# Patient Record
Sex: Female | Born: 1960 | Race: White | Hispanic: No | Marital: Married | Smoking: Never smoker
Health system: Southern US, Community
[De-identification: ages and names within clinical notes are randomized; demographics above are authoritative.]

## PROBLEM LIST (undated history)

## (undated) DIAGNOSIS — M8580 Other specified disorders of bone density and structure, unspecified site: Secondary | ICD-10-CM

## (undated) HISTORY — DX: Other specified disorders of bone density and structure, unspecified site: M85.80

## (undated) HISTORY — PX: OTHER SURGICAL HISTORY: SHX169

---

## 1999-12-26 ENCOUNTER — Encounter: Admission: RE | Admit: 1999-12-26 | Discharge: 1999-12-26 | Payer: Self-pay | Admitting: Obstetrics and Gynecology

## 1999-12-26 ENCOUNTER — Encounter: Payer: Self-pay | Admitting: Obstetrics and Gynecology

## 2000-10-20 ENCOUNTER — Encounter (INDEPENDENT_AMBULATORY_CARE_PROVIDER_SITE_OTHER): Payer: Self-pay | Admitting: *Deleted

## 2000-10-20 ENCOUNTER — Other Ambulatory Visit: Admission: RE | Admit: 2000-10-20 | Discharge: 2000-10-20 | Payer: Self-pay | Admitting: Obstetrics and Gynecology

## 2000-12-27 ENCOUNTER — Encounter: Admission: RE | Admit: 2000-12-27 | Discharge: 2000-12-27 | Payer: Self-pay | Admitting: Obstetrics and Gynecology

## 2000-12-27 ENCOUNTER — Encounter: Payer: Self-pay | Admitting: Obstetrics and Gynecology

## 2001-06-21 ENCOUNTER — Encounter: Payer: Self-pay | Admitting: Family Medicine

## 2001-06-21 ENCOUNTER — Encounter: Admission: RE | Admit: 2001-06-21 | Discharge: 2001-06-21 | Payer: Self-pay | Admitting: Family Medicine

## 2001-12-29 ENCOUNTER — Encounter: Payer: Self-pay | Admitting: Obstetrics and Gynecology

## 2001-12-29 ENCOUNTER — Encounter: Admission: RE | Admit: 2001-12-29 | Discharge: 2001-12-29 | Payer: Self-pay | Admitting: Obstetrics and Gynecology

## 2002-01-04 ENCOUNTER — Encounter: Admission: RE | Admit: 2002-01-04 | Discharge: 2002-01-04 | Payer: Self-pay | Admitting: Obstetrics and Gynecology

## 2002-01-04 ENCOUNTER — Encounter: Payer: Self-pay | Admitting: Obstetrics and Gynecology

## 2003-01-19 ENCOUNTER — Encounter: Admission: RE | Admit: 2003-01-19 | Discharge: 2003-01-19 | Payer: Self-pay | Admitting: Obstetrics and Gynecology

## 2003-01-19 ENCOUNTER — Encounter: Payer: Self-pay | Admitting: Obstetrics and Gynecology

## 2004-01-22 ENCOUNTER — Encounter: Admission: RE | Admit: 2004-01-22 | Discharge: 2004-01-22 | Payer: Self-pay | Admitting: Obstetrics and Gynecology

## 2005-02-04 ENCOUNTER — Encounter: Admission: RE | Admit: 2005-02-04 | Discharge: 2005-02-04 | Payer: Self-pay | Admitting: Obstetrics and Gynecology

## 2006-02-10 ENCOUNTER — Encounter: Admission: RE | Admit: 2006-02-10 | Discharge: 2006-02-10 | Payer: Self-pay | Admitting: Obstetrics and Gynecology

## 2007-02-15 ENCOUNTER — Encounter: Admission: RE | Admit: 2007-02-15 | Discharge: 2007-02-15 | Payer: Self-pay | Admitting: Obstetrics and Gynecology

## 2008-02-20 ENCOUNTER — Encounter: Admission: RE | Admit: 2008-02-20 | Discharge: 2008-02-20 | Payer: Self-pay | Admitting: Obstetrics and Gynecology

## 2009-02-20 ENCOUNTER — Encounter: Admission: RE | Admit: 2009-02-20 | Discharge: 2009-02-20 | Payer: Self-pay | Admitting: Obstetrics and Gynecology

## 2010-02-26 ENCOUNTER — Encounter: Admission: RE | Admit: 2010-02-26 | Discharge: 2010-02-26 | Payer: Self-pay | Admitting: Obstetrics and Gynecology

## 2012-03-14 ENCOUNTER — Other Ambulatory Visit: Payer: Self-pay | Admitting: Obstetrics and Gynecology

## 2012-03-14 DIAGNOSIS — R928 Other abnormal and inconclusive findings on diagnostic imaging of breast: Secondary | ICD-10-CM

## 2012-03-21 ENCOUNTER — Ambulatory Visit
Admission: RE | Admit: 2012-03-21 | Discharge: 2012-03-21 | Disposition: A | Discharge status: Home / Self Care | Payer: 59 | Source: Ambulatory Visit | Attending: Obstetrics and Gynecology | Admitting: Obstetrics and Gynecology

## 2012-03-21 DIAGNOSIS — R928 Other abnormal and inconclusive findings on diagnostic imaging of breast: Secondary | ICD-10-CM

## 2015-05-30 MED FILL — ONDANSETRON HCL 8 MG TABLET: 8 | 8 days supply | Qty: 30 | Fill #0

## 2015-06-05 DIAGNOSIS — Z13820 Encounter for screening for osteoporosis: Secondary | ICD-10-CM | POA: Diagnosis not present

## 2015-06-05 DIAGNOSIS — Z8249 Family history of ischemic heart disease and other diseases of the circulatory system: Secondary | ICD-10-CM | POA: Diagnosis not present

## 2015-06-05 DIAGNOSIS — Z13220 Encounter for screening for lipoid disorders: Secondary | ICD-10-CM | POA: Diagnosis not present

## 2015-06-05 DIAGNOSIS — N83291 Other ovarian cyst, right side: Secondary | ICD-10-CM | POA: Diagnosis not present

## 2015-06-05 DIAGNOSIS — Z131 Encounter for screening for diabetes mellitus: Secondary | ICD-10-CM | POA: Diagnosis not present

## 2015-06-05 DIAGNOSIS — Z1329 Encounter for screening for other suspected endocrine disorder: Secondary | ICD-10-CM | POA: Diagnosis not present

## 2015-06-05 DIAGNOSIS — Z1321 Encounter for screening for nutritional disorder: Secondary | ICD-10-CM | POA: Diagnosis not present

## 2015-06-17 DIAGNOSIS — I493 Ventricular premature depolarization: Secondary | ICD-10-CM | POA: Diagnosis not present

## 2015-07-09 DIAGNOSIS — D2371 Other benign neoplasm of skin of right lower limb, including hip: Secondary | ICD-10-CM | POA: Diagnosis not present

## 2015-07-09 DIAGNOSIS — D1801 Hemangioma of skin and subcutaneous tissue: Secondary | ICD-10-CM | POA: Diagnosis not present

## 2015-07-09 DIAGNOSIS — L820 Inflamed seborrheic keratosis: Secondary | ICD-10-CM | POA: Diagnosis not present

## 2015-07-09 DIAGNOSIS — D2239 Melanocytic nevi of other parts of face: Secondary | ICD-10-CM | POA: Diagnosis not present

## 2015-09-03 DIAGNOSIS — Z0000 Encounter for general adult medical examination without abnormal findings: Secondary | ICD-10-CM | POA: Diagnosis not present

## 2015-09-03 DIAGNOSIS — E559 Vitamin D deficiency, unspecified: Secondary | ICD-10-CM | POA: Diagnosis not present

## 2015-09-03 DIAGNOSIS — I493 Ventricular premature depolarization: Secondary | ICD-10-CM | POA: Diagnosis not present

## 2015-09-23 DIAGNOSIS — M25562 Pain in left knee: Secondary | ICD-10-CM | POA: Diagnosis not present

## 2015-09-26 ENCOUNTER — Ambulatory Visit: Payer: 59 | Attending: Orthopaedic Surgery

## 2015-09-26 DIAGNOSIS — M25562 Pain in left knee: Secondary | ICD-10-CM | POA: Diagnosis not present

## 2015-09-26 DIAGNOSIS — M6281 Muscle weakness (generalized): Secondary | ICD-10-CM | POA: Diagnosis not present

## 2015-09-26 DIAGNOSIS — R262 Difficulty in walking, not elsewhere classified: Secondary | ICD-10-CM | POA: Diagnosis not present

## 2015-09-26 NOTE — Therapy (Signed)
[place], [place], [address] Phone: [phone]   Fax:  [phone]  Physical Therapy Evaluation  Patient Details  Name: Pamela Monroe MRN: [id number] Date of Birth: [birth year] Referring Provider: Melrose Nakayama, MD  Encounter Date: 09/26/2015      PT End of Session - 09/26/15 1407    Visit Number 1   Number of Visits 12   Date for PT Re-Evaluation 11/07/15   Authorization Type UMR   PT Start Time 0130   PT Stop Time 0208   PT Time Calculation (min) 38 min   Activity Tolerance Patient tolerated treatment well   Behavior During Therapy [employer] for tasks assessed/performed      No past medical history on file.  No past surgical history on file.  There were no vitals filed for this visit.       Subjective Assessment - 09/26/15 1335    Subjective She reports onset of pain  without injury.  and may have been caused by RT foot pain  that mat have changed gait. she has pain with movements walking up stairs, general walking and generally with bending LT knee.. MD said to come to PT and take aleve.    Limitations Walking  stairs sit with flexed knee   How long can you sit comfortably? As needed   How long can you stand comfortably? As needed   How long can you walk comfortably? As needed   Diagnostic tests xrays negative   Patient Stated Goals To feel like RT knee   Currently in Pain? Yes   Pain Score 4    Pain Location Knee   Pain Orientation Left;Lower   Pain Descriptors / Indicators Dull;Aching  sharper with going sit to stand to sit   Pain Type Acute pain   Pain Onset 1 to 4 weeks ago   Pain Frequency Intermittent  iwth medication   Aggravating Factors  transition movement , flexion knee and stairs   Pain Relieving Factors Aleve   Multiple Pain Sites No            [facility] PT Assessment - 09/26/15 1325    Assessment   Medical Diagnosis LT distal IT band syndrome   Referring Provider  Melrose Nakayama, MD   Onset Date/Surgical Date --  3 weeks ago   Next MD Visit 2-3 weeks if needed   Prior Therapy No   Precautions   Precautions None   Restrictions   Weight Bearing Restrictions No   Balance Screen   Has the patient fallen in the past 6 months No   Has the patient had a decrease in activity level because of a fear of falling?  No   Is the patient reluctant to leave their home because of a fear of falling?  No   Prior Function   Level of Independence Independent   Cognition   Overall Cognitive Status Within Functional Limits for tasks assessed   Functional Tests   Functional tests Squat   Squat   Comments 1/4 range or less with pain laterla Lt knee   ROM / Strength   AROM / PROM / Strength AROM;Strength   AROM   Overall AROM Comments All hip ROM equal to RT   AROM Assessment Site Hip;Knee   Right/Left Hip Right;Left   Right/Left Knee Right;Left   Right Knee Extension 0   Right Knee Flexion 138   Left Knee Extension 0   Left Knee Flexion 138  Strength   Overall Strength Comments Normal both legs except LT quads and hamstrings 4+/5 with pain She did not have increased pian with hip testing but with quad and hamstring testing .    Flexibility   Soft Tissue Assessment /Muscle Length --   Hamstrings RT 60 LT 55    Palpation   Palpation comment tender to touch along lateral  knee joint line more posterior .    Special Tests    Special Tests Hip Special Tests   Hip Special Tests  Marcello Moores Test;Patrick (FABER) Test;Ober's Test   Saralyn Pilar [facility]) Test   Findings Negative   Side Right;Left   Thomas Test    Findings Negative   Side Right;Left   Ober's Test   Findings Positive   Side Left   Comments for pain range WNL   Ambulation/Gait   Gait Comments WNL                           PT Education - 09/26/15 1406    Education provided Yes   Education Details POC, ITB stretch SLR   Person(s) Educated Patient   Methods  Explanation;Demonstration;Verbal cues;Handout   Comprehension Returned demonstration;Verbalized understanding          PT Short Term Goals - 09/26/15 1409    PT SHORT TERM GOAL #1   Title She will be independent with inital HEP   Time 2   Period Weeks   Status New   PT SHORT TERM GOAL #2   Title She will report pain decr 30% or mroe with wlaking   Time 3   Period Weeks   Status New           PT Long Term Goals - 09/26/15 1409    PT LONG TERM GOAL #1   Title She wil be able to demo all HEP issued   Time 6   PT LONG TERM GOAL #2   Title She will report 75% improvement with pain and able to walk and sit with no pain   Time 6   Period Weeks   Status New   PT LONG TERM GOAL #3   Title she will be able to walk steps at work with 1-2/10 max pain.   Time 6   Period Weeks   Status New   PT LONG TERM GOAL #4   Title She will  be able to rise and sit from chair with no pain.    Time 6   Period Weeks   Status New   PT LONG TERM GOAL #5   Title She will be able to flex and extend Lt knee without pain with good speed.    Time 6   Period Weeks   Status New               Plan - 09/26/15 1407    Clinical Impression Statement Ms Kai Levins presents with low complexity eval with lateral LT knee pain affecting her mobility. She should be improved post PT.    Rehab Potential Good   PT Frequency 2x / week   PT Duration 6 weeks  if needed   PT Treatment/Interventions Cryotherapy;Iontophoresis 4mg /ml Dexamethasone;Ultrasound;Moist Heat;Therapeutic exercise;Patient/family education;Taping;Manual techniques   PT Next Visit Plan Ionto if ordered, [place] ,ice ,STW, stretching    PT Home Exercise Plan SLR , ITB stretch   Consulted and Agree with Plan of Care Patient      Patient will benefit from skilled  therapeutic intervention in order to improve the following deficits and impairments:  Pain, Decreased mobility, Decreased activity tolerance, Difficulty walking, Decreased  strength  Visit Diagnosis: Difficulty in walking, not elsewhere classified - Plan: PT plan of care cert/re-cert  Pain in left knee - Plan: PT plan of care cert/re-cert  Muscle weakness (generalized) - Plan: PT plan of care cert/re-cert     Problem List There are no active problems to display for this patient.   Darrel Hoover  PT 09/26/2015, 2:17 PM  [facility] [address] [place], [place], [address] Phone: [phone]   Fax:  [phone]  Name: CORIANNE WERDER MRN: [id number] Date of Birth: [birth year]

## 2015-09-26 NOTE — Patient Instructions (Signed)
From cabinet issued SLR 20-30 reps  Daily and It band stretch 2x/day 30 sec 2-3 reps

## 2015-09-30 ENCOUNTER — Ambulatory Visit: Payer: 59 | Admitting: Physical Therapy

## 2015-10-01 ENCOUNTER — Ambulatory Visit: Payer: 59 | Admitting: Physical Therapy

## 2015-10-01 DIAGNOSIS — R262 Difficulty in walking, not elsewhere classified: Secondary | ICD-10-CM

## 2015-10-01 DIAGNOSIS — M25562 Pain in left knee: Secondary | ICD-10-CM

## 2015-10-01 DIAGNOSIS — M6281 Muscle weakness (generalized): Secondary | ICD-10-CM | POA: Diagnosis not present

## 2015-10-01 NOTE — Therapy (Signed)
[place], [place], [address] Phone: [phone]   Fax:  [phone]  Physical Therapy Treatment  Patient Details  Name: Pamela Monroe MRN: [id number] Date of Birth: 10/21/1960 Referring Provider: Melrose Nakayama, MD  Encounter Date: 10/01/2015      PT End of Session - 10/01/15 1816    Visit Number 2   Number of Visits 12   Date for PT Re-Evaluation 11/07/15   PT Start Time 0731   PT Stop Time 0805   PT Time Calculation (min) 34 min   Activity Tolerance Patient tolerated treatment well   Behavior During Therapy [facility] for tasks assessed/performed      No past medical history on file.  No past surgical history on file.  There were no vitals filed for this visit.      Subjective Assessment - 10/01/15 0733    Subjective She has been doing her home exercises.  I feel a little better.   Currently in Pain? Yes   Pain Score 4    Pain Location Knee   Pain Orientation Left;Lower;Lateral   Pain Descriptors / Indicators Aching;Dull   Pain Frequency Intermittent   Aggravating Factors  walking on stairs,     Pain Relieving Factors exercises                         [facility] Adult PT Treatment/Exercise - 10/01/15 0001    Self-Care   Self-Care --  Ionto guidelines/ precautions, contraindications, reviewed.   Ultrasound   Ultrasound Location distal lateral knee   Ultrasound Parameters 100%, 8 moinutes 1.0 watts / cm2.    Ultrasound Goals Pain   Iontophoresis   Type of Iontophoresis Dexamethasone   Location knee, lateral, distal IT band   Dose 4 mg/ml , 1cc   Time 6   Manual Therapy   Manual therapy comments soft tissue work lateral distal thigh/knee,  tissue sensitive, syiff, congested. ans was softened,  areas of tenderness proximal to joint line.                PT Education - 10/01/15 1816    Education provided Yes   Education Details Ionto information   Person(s)  Educated Patient   Methods Explanation;Handout   Comprehension Verbalized understanding          PT Short Term Goals - 10/01/15 1818    PT SHORT TERM GOAL #1   Title She will be independent with inital HEP   Period Weeks   Status On-going   PT SHORT TERM GOAL #2   Title She will report pain decr 30% or mroe with wlaking   Time 3   Period Weeks   Status On-going           PT Long Term Goals - 09/26/15 1409    PT LONG TERM GOAL #1   Title She wil be able to demo all HEP issued   Time 6   PT LONG TERM GOAL #2   Title She will report 75% improvement with pain and able to walk and sit with no pain   Time 6   Period Weeks   Status New   PT LONG TERM GOAL #3   Title she will be able to walk steps at work with 1-2/10 max pain.   Time 6   Period Weeks   Status New   PT LONG TERM GOAL #4   Title She will  be able  to rise and sit from chair with no pain.    Time 6   Period Weeks   Status New   PT LONG TERM GOAL #5   Title She will be able to flex and extend Lt knee without pain with good speed.    Time 6   Period Weeks   Status New               Plan - 10/01/15 1817    Clinical Impression Statement Pain relief focus today.  No new goals met.    PT Next Visit Plan assess ionto/US/Manual,  review exercises,  progress home exercises if able,  (Bend knee stretch was painful at home need to practice/review for technique)   PT Home Exercise Plan SLR , ITB stretchcontinue   Consulted and Agree with Plan of Care Patient      Patient will benefit from skilled therapeutic intervention in order to improve the following deficits and impairments:  Pain, Decreased mobility, Decreased activity tolerance, Difficulty walking, Decreased strength  Visit Diagnosis: Difficulty in walking, not elsewhere classified  Pain in left knee  Muscle weakness (generalized)     Problem List There are no active problems to display for this patient.   [facility] 10/01/2015, 6:20  PM  [facility] [address] [place], [place], [address] Phone: [phone]   Fax:  [phone]  Name: Pamela Monroe MRN: [id number] Date of Birth: 08/24/1960    Melvenia Needles, PTA 10/01/2015 6:20 PM Phone: [phone] Fax: [phone]

## 2015-10-01 NOTE — Patient Instructions (Signed)

## 2015-10-04 ENCOUNTER — Ambulatory Visit: Payer: 59

## 2015-10-04 DIAGNOSIS — M25562 Pain in left knee: Secondary | ICD-10-CM | POA: Diagnosis not present

## 2015-10-04 DIAGNOSIS — M6281 Muscle weakness (generalized): Secondary | ICD-10-CM | POA: Diagnosis not present

## 2015-10-04 DIAGNOSIS — R262 Difficulty in walking, not elsewhere classified: Secondary | ICD-10-CM

## 2015-10-04 MED FILL — valACYclovir HCL 1 GM TABS: 1 | 8 days supply | Qty: 30 | Fill #1

## 2015-10-04 NOTE — Patient Instructions (Signed)
From cabinet issued Pilates clams, SLR on side and standing hip abduction into wall 1-2x/day 10-15 reps 1-2 sets

## 2015-10-04 NOTE — Therapy (Signed)
[place], [place], [address] Phone: [phone]   Fax:  [phone]  Physical Therapy Treatment  Patient Details  Name: Pamela Monroe MRN: [id number] Date of Birth: 05-Feb-1961 Referring Provider: Melrose Nakayama, MD  Encounter Date: 10/04/2015      PT End of Session - 10/04/15 0752    Visit Number 3   Number of Visits 12   Date for PT Re-Evaluation 11/07/15   PT Start Time 0658   PT Stop Time 0745   PT Time Calculation (min) 47 min   Activity Tolerance Patient tolerated treatment well;No increased pain   Behavior During Therapy [facility] for tasks assessed/performed      No past medical history on file.  No past surgical history on file.  There were no vitals filed for this visit.      Subjective Assessment - 10/04/15 0700    Subjective Knee is not worse and ther are periods of improvement. Getting up from sitting is most painful   Currently in Pain? Yes   Pain Score 3    Pain Location Knee   Pain Orientation Left;Lateral   Pain Descriptors / Indicators Dull;Aching   Pain Type Acute pain   Pain Onset 1 to 4 weeks ago   Pain Frequency Intermittent   Multiple Pain Sites No                         [facility] Adult PT Treatment/Exercise - 10/04/15 0001    Exercises   Exercises Knee/Hip   Knee/Hip Exercises: Stretches   ITB Stretch Left;2 reps;60 seconds   ITB Stretch Limitations Modified with pillow support and cues for pelvic and leg position.    Knee/Hip Exercises: Standing   Abduction Limitations with bent knee isometrics into wll x 5 RT and LT 3-5 sec cue for pelvic position.   Knee/Hip Exercises: Sidelying   Hip ABduction Left;2 sets;10 reps   Hip ABduction Limitations with extension   Clams x 15 with demo and verbal cues.    Ultrasound   Ultrasound Location distal lateral left thigh   Ultrasound Parameters 100% 1MHx 1.5 wcm2   Ultrasound Goals Pain   Iontophoresis   Type of Iontophoresis Dexamethasone   Location knee, lateral, distal IT band   Dose 4 mg/ml , 1cc   Time 4-6 hours   Manual Therapy   Manual therapy comments soft tissue work lateral distal thigh/knee with rock tool,   areas of tenderness proximal to joint line.                PT Education - 10/04/15 0751    Education provided Yes   Education Details HEP hip strength   Person(s) Educated Patient   Methods Explanation;Demonstration;Tactile cues;Verbal cues;Handout   Comprehension Returned demonstration;Verbalized understanding          PT Short Term Goals - 10/04/15 0754    PT SHORT TERM GOAL #1   Title She will be independent with inital HEP   Status Achieved   PT SHORT TERM GOAL #2   Title She will report pain decr 30% or more with wlaking   Status Achieved           PT Long Term Goals - 09/26/15 1409    PT LONG TERM GOAL #1   Title She wil be able to demo all HEP issued   Time 6   PT LONG TERM GOAL #2   Title She will report  75% improvement with pain and able to walk and sit with no pain   Time 6   Period Weeks   Status New   PT LONG TERM GOAL #3   Title she will be able to walk steps at work with 1-2/10 max pain.   Time 6   Period Weeks   Status New   PT LONG TERM GOAL #4   Title She will  be able to rise and sit from chair with no pain.    Time 6   Period Weeks   Status New   PT LONG TERM GOAL #5   Title She will be able to flex and extend Lt knee without pain with good speed.    Time 6   Period Weeks   Status New               Plan - 10/04/15 [id number]    Clinical Impression Statement She is able to do all exercise correctly without pain  and when ITB stretch modified. Skin clear post ionto. Continues with pain on  transition movements out of chair.  continue modalities, manual   PT Next Visit Plan Continue Ionto , manual , [place] , Review HEP   PT Home Exercise Plan laterla hip strength exercise   Consulted and Agree with Plan of Care  Patient      Patient will benefit from skilled therapeutic intervention in order to improve the following deficits and impairments:  Pain, Decreased mobility, Decreased activity tolerance, Difficulty walking, Decreased strength  Visit Diagnosis: Difficulty in walking, not elsewhere classified  Pain in left knee  Muscle weakness (generalized)     Problem List There are no active problems to display for this patient.   Darrel Hoover  PT 10/04/2015, 7:55 AM  [facility] [address] [place], [place], [address] Phone: [phone]   Fax:  [phone]  Name: Pamela Monroe MRN: [id number] Date of Birth: [birth year]

## 2015-10-09 ENCOUNTER — Ambulatory Visit: Payer: 59

## 2015-10-09 DIAGNOSIS — M25562 Pain in left knee: Secondary | ICD-10-CM

## 2015-10-09 DIAGNOSIS — M6281 Muscle weakness (generalized): Secondary | ICD-10-CM

## 2015-10-09 DIAGNOSIS — R262 Difficulty in walking, not elsewhere classified: Secondary | ICD-10-CM | POA: Diagnosis not present

## 2015-10-09 NOTE — Therapy (Signed)
[place], [place], [address] Phone: [phone]   Fax:  [phone]  Physical Therapy Treatment  Patient Details  Name: Pamela Monroe MRN: [id number] Date of Birth: [birth year] Referring Provider: Melrose Nakayama, MD  Encounter Date: 10/09/2015      PT End of Session - 10/09/15 0709    Visit Number 4   Number of Visits 12   Date for PT Re-Evaluation 11/07/15   PT Start Time 0700   PT Stop Time 0745   PT Time Calculation (min) 45 min   Activity Tolerance Patient tolerated treatment well   Behavior During Therapy [facility] for tasks assessed/performed      No past medical history on file.  No past surgical history on file.  There were no vitals filed for this visit.      Subjective Assessment - 10/09/15 0705    Subjective Knee better but pain now at fibular head area. Turnig in  bed can give sharper pain . Going up stairs dull pain   Currently in Pain? No/denies   Pain Score 2    Pain Location Knee   Pain Orientation Left   Pain Descriptors / Indicators Dull;Sharp   Pain Type Acute pain   Pain Onset More than a month ago   Pain Frequency Intermittent   Aggravating Factors  stairs   Multiple Pain Sites No                         [facility] Adult PT Treatment/Exercise - 10/09/15 0713    Knee/Hip Exercises: Sidelying   Hip ABduction Left;2 sets;10 reps   Hip ABduction Limitations with red band   Clams x 15 with demo and verbal cues.   red band   Other Sidelying Knee/Hip Exercises LT hip IR 2x10 red band   Ultrasound   Ultrasound Location Later Lt knee   Iontophoresis   Type of Iontophoresis Dexamethasone   Location knee, lateral, distal IT band   Dose 4 mg/ml , 1cc   Time 4-6 hours   Manual Therapy   Manual therapy comments soft tissue work lateral distal hamstring insertion and muscle and Pa mobs to fibular head,                     PT Short Term Goals - 10/04/15  0754    PT SHORT TERM GOAL #1   Title She will be independent with inital HEP   Status Achieved   PT SHORT TERM GOAL #2   Title She will report pain decr 30% or more with wlaking   Status Achieved           PT Long Term Goals - 10/09/15 0752    PT LONG TERM GOAL #1   Title She wil be able to demo all HEP issued   Status On-going   PT LONG TERM GOAL #2   Title She will report 75% improvement with pain and able to walk and sit with no pain   Status Partially Met   PT LONG TERM GOAL #3   Title she will be able to walk steps at work with 1-2/10 max pain.   Status On-going   PT LONG TERM GOAL #4   Title She will  be able to rise and sit from chair with no pain.    Status Partially Met   PT LONG TERM GOAL #5   Title She will be able to flex  and extend Lt knee without pain with good speed.    Status Partially Met               Plan - 10/09/15 0750    Clinical Impression Statement Added red band to HEP without pain. She is improving but now more post lateral knee distal hamstring insertion area. Progressing toward goals   PT Next Visit Plan Continue Ionto , manual , [place] , Review HEP   Consulted and Agree with Plan of Care Patient      Patient will benefit from skilled therapeutic intervention in order to improve the following deficits and impairments:  Pain, Decreased mobility, Decreased activity tolerance, Difficulty walking, Decreased strength  Visit Diagnosis: Difficulty in walking, not elsewhere classified  Pain in left knee  Muscle weakness (generalized)     Problem List There are no active problems to display for this patient.   Darrel Hoover PT  10/09/2015, 7:53 AM  [facility] [address] [place], [place], [address] Phone: [phone]   Fax:  [phone]  Name: Pamela Monroe MRN: [id number] Date of Birth: March 07, 1961

## 2015-10-10 ENCOUNTER — Ambulatory Visit: Payer: 59 | Attending: Orthopaedic Surgery | Admitting: Physical Therapy

## 2015-10-10 DIAGNOSIS — R262 Difficulty in walking, not elsewhere classified: Secondary | ICD-10-CM

## 2015-10-10 DIAGNOSIS — M25562 Pain in left knee: Secondary | ICD-10-CM | POA: Diagnosis not present

## 2015-10-10 DIAGNOSIS — M6281 Muscle weakness (generalized): Secondary | ICD-10-CM

## 2015-10-10 NOTE — Therapy (Signed)
[place], [place], [address] Phone: [phone]   Fax:  [phone]  Physical Therapy Treatment  Patient Details  Name: Pamela Monroe MRN: [id number] Date of Birth: 19-Jun-1960 Referring Provider: Melrose Nakayama, MD  Encounter Date: 10/10/2015      PT End of Session - 10/10/15 1509    Visit Number 5   Number of Visits 12   Date for PT Re-Evaluation 11/07/15   Authorization Type UMR   PT Start Time 0306   PT Stop Time 0344   PT Time Calculation (min) 38 min      No past medical history on file.  No past surgical history on file.  There were no vitals filed for this visit.      Subjective Assessment - 10/10/15 1509    Subjective I am much better than i was    Currently in Pain? No/denies   Aggravating Factors  getting up from toilet, getting in /out of car   Pain Relieving Factors ionto patch                         [facility] Adult PT Treatment/Exercise - 10/10/15 0001    Knee/Hip Exercises: Stretches   Active Hamstring Stretch 3 reps;30 seconds   Active Hamstring Stretch Limitations with strap   ITB Stretch Left;3 reps;30 seconds   ITB Stretch Limitations with strap   Knee/Hip Exercises: Supine   Bridges with Clamshell 10 reps   Single Leg Bridge --  painful   Knee/Hip Exercises: Prone   Hamstring Curl 10 reps  mild pain   Hip Extension 10 reps  no pain   Hip Extension Limitations donkey kicks x 10 -increased pain   Ultrasound   Ultrasound Location lateral posterior knee (hamstring insertion)   Ultrasound Parameters 100% 1.5 w/cm2 1 MHZ   Ultrasound Goals Pain   Iontophoresis   Type of Iontophoresis Dexamethasone   Location knee, lateral, hamstring insertion   Dose 4 mg/ml , 1cc   Time 4-6 hours                  PT Short Term Goals - 10/04/15 0754    PT SHORT TERM GOAL #1   Title She will be independent with inital HEP   Status Achieved   PT SHORT  TERM GOAL #2   Title She will report pain decr 30% or more with wlaking   Status Achieved           PT Long Term Goals - 10/09/15 0752    PT LONG TERM GOAL #1   Title She wil be able to demo all HEP issued   Status On-going   PT LONG TERM GOAL #2   Title She will report 75% improvement with pain and able to walk and sit with no pain   Status Partially Met   PT LONG TERM GOAL #3   Title she will be able to walk steps at work with 1-2/10 max pain.   Status On-going   PT LONG TERM GOAL #4   Title She will  be able to rise and sit from chair with no pain.    Status Partially Met   PT LONG TERM GOAL #5   Title She will be able to flex and extend Lt knee without pain with good speed.    Status Partially Met               Plan -  10/10/15 1621    Clinical Impression Statement Pain with lateral hamstring pain during most exercises today. Encouraged pt to stretch hamstring at home. Ultrasound and ionto repeated to area. Pt reports the ionto patch was very helpful yesteray. She still feels pain wtih transitions like sit-stand.    PT Next Visit Plan Continue Ionto , manual , [place] , Review HEP      Patient will benefit from skilled therapeutic intervention in order to improve the following deficits and impairments:  Pain, Decreased mobility, Decreased activity tolerance, Difficulty walking, Decreased strength  Visit Diagnosis: Difficulty in walking, not elsewhere classified  Pain in left knee  Muscle weakness (generalized)     Problem List There are no active problems to display for this patient.   Dorene Ar, [place] 10/10/2015, 4:25 PM  [facility] [address] [place], [place], [address] Phone: [phone]   Fax:  [phone]  Name: Pamela Monroe MRN: [id number] Date of Birth: 21-May-1960

## 2015-10-14 ENCOUNTER — Ambulatory Visit: Payer: 59 | Admitting: Physical Therapy

## 2015-10-14 DIAGNOSIS — M6281 Muscle weakness (generalized): Secondary | ICD-10-CM

## 2015-10-14 DIAGNOSIS — R262 Difficulty in walking, not elsewhere classified: Secondary | ICD-10-CM | POA: Diagnosis not present

## 2015-10-14 DIAGNOSIS — M25562 Pain in left knee: Secondary | ICD-10-CM | POA: Diagnosis not present

## 2015-10-14 NOTE — Patient Instructions (Signed)
Calf Stretch    Place one leg forward, bent, other leg behind and straight. Lean forward keeping back heel flat. Hold _30___ seconds while counting out loud. Repeat with other leg forward. Repeat 3____ times. Do ___1-2_ sessions per day.  http://gt2.exer.us/478   Copyright  VHI. All rights reserved.

## 2015-10-14 NOTE — Therapy (Signed)
[place], [place], [address] Phone: [phone]   Fax:  [phone]  Physical Therapy Treatment  Patient Details  Name: Pamela Monroe MRN: [id number] Date of Birth: 29-Sep-1960 Referring Provider: Melrose Nakayama, MD  Encounter Date: 10/14/2015      PT End of Session - 10/14/15 0805    Visit Number 6   Number of Visits 12   Date for PT Re-Evaluation 11/07/15   PT Start Time 0731   PT Stop Time 0800   PT Time Calculation (min) 29 min   Activity Tolerance Patient tolerated treatment well;No increased pain   Behavior During Therapy [facility] for tasks assessed/performed      No past medical history on file.  No past surgical history on file.  There were no vitals filed for this visit.      Subjective Assessment - 10/14/15 0734    Subjective 1-2/10 .  with going up steps.  Able to exercise in the pool. Pain has moved lateral knee and proximal leg   Currently in Pain? No/denies   Pain Score 2    Pain Location Knee   Pain Orientation Lateral   Pain Descriptors / Indicators Dull;Sharp   Pain Frequency Intermittent   Aggravating Factors  steps, a certain pair of shoes   Pain Relieving Factors PT,                         [facility] Adult PT Treatment/Exercise - 10/14/15 0001    Knee/Hip Exercises: Stretches   Passive Hamstring Stretch 3 reps;30 seconds   Passive Hamstring Stretch Limitations with strap   Gastroc Stretch 3 reps;30 seconds  HEP   Ultrasound   Ultrasound Location Lateral posterior knee   Ultrasound Parameters 100 % 1.5 watts/cm2 X 10 minutes   Ultrasound Goals Pain   Iontophoresis   Type of Iontophoresis Dexamethasone   Location Lateral knee   Dose '4mg'$ /ml , 1cc   Time 4-6 hour/ 6 minutes                PT Education - 10/14/15 0801    Education provided Yes   Education Details calf stretch   Person(s) Educated Patient   Methods  Explanation;Demonstration;Verbal cues;Handout;Tactile cues   Comprehension Verbalized understanding;Returned demonstration          PT Short Term Goals - 10/04/15 0754    PT SHORT TERM GOAL #1   Title She will be independent with inital HEP   Status Achieved   PT SHORT TERM GOAL #2   Title She will report pain decr 30% or more with wlaking   Status Achieved           PT Long Term Goals - 10/14/15 0809    PT LONG TERM GOAL #1   Title She wil be able to demo all HEP issued   Time 6   Period Weeks   Status On-going   PT LONG TERM GOAL #2   Title She will report 75% improvement with pain and able to walk and sit with no pain   Time 6   Period Weeks   Status Unable to assess   PT LONG TERM GOAL #3   Title she will be able to walk steps at work with 1-2/10 max pain.   Baseline able to do over the weekend, not sure if consistant   Time 6   Period Weeks   Status Partially Met   PT LONG  TERM GOAL #4   Title She will  be able to rise and sit from chair with no pain.    Time 6   Period Weeks   Status Unable to assess   PT LONG TERM GOAL #5   Title She will be able to flex and extend Lt knee without pain with good speed.    Time 6   Period Weeks   Status Unable to assess               Plan - 10/14/15 0806    Clinical Impression Statement Progress toward home exercises.  Pain decreased to 1-2/10 with steps , now able to go step over step. No pain with session.    PT Next Visit Plan Continue Ionto , manual , [place] , Review HEP   PT Home Exercise Plan calf stretch   Consulted and Agree with Plan of Care Patient      Patient will benefit from skilled therapeutic intervention in order to improve the following deficits and impairments:  Pain, Decreased mobility, Decreased activity tolerance, Difficulty walking, Decreased strength  Visit Diagnosis: Difficulty in walking, not elsewhere classified  Pain in left knee  Muscle weakness (generalized)     Problem  List There are no active problems to display for this patient.   [facility] 10/14/2015, 8:11 AM  [place], [place], [address] Phone: [phone]   Fax:  [phone]  Name: Pamela Monroe MRN: [id number] Date of Birth: March 27, 1961    Melvenia Needles, PTA 10/14/2015 8:11 AM Phone: [phone] Fax: [phone]

## 2015-10-17 ENCOUNTER — Ambulatory Visit: Payer: 59

## 2015-10-17 DIAGNOSIS — M25562 Pain in left knee: Secondary | ICD-10-CM | POA: Diagnosis not present

## 2015-10-17 DIAGNOSIS — R262 Difficulty in walking, not elsewhere classified: Secondary | ICD-10-CM

## 2015-10-17 DIAGNOSIS — M6281 Muscle weakness (generalized): Secondary | ICD-10-CM | POA: Diagnosis not present

## 2015-10-17 NOTE — Therapy (Addendum)
[place], [place], [address] Phone: [phone]   Fax:  [phone]  Physical Therapy Treatment/ Discharge  Patient Details  Name: Pamela Monroe MRN: [id number] Date of Birth: Nov 30, 1960 Referring Provider: Melrose Nakayama, MD  Encounter Date: 10/17/2015      PT End of Session - 10/17/15 1547    Visit Number 7   Number of Visits 12   Date for PT Re-Evaluation 11/07/15   PT Start Time 0345   PT Stop Time 0415   PT Time Calculation (min) 30 min   Activity Tolerance Patient tolerated treatment well   Behavior During Therapy [facility] for tasks assessed/performed      No past medical history on file.  No past surgical history on file.  There were no vitals filed for this visit.      Subjective Assessment - 10/17/15 1705    Subjective doing well no pain just tenderness proximal lateral calf just posteior to fibula head. She is able to walk steps and generally she has no pain. She reports 90% or more improvement. She is able to walk without pain   Currently in Pain? No/denies                         [facility] Adult PT Treatment/Exercise - 10/17/15 0001    Knee/Hip Exercises: Stretches   Passive Hamstring Stretch Left;3 reps;60 seconds   Passive Hamstring Stretch Limitations with strap   Gastroc Stretch 3 reps;30 seconds  standing   Ultrasound   Ultrasound Location hz, 1.3 Wcm2   Ultrasound Parameters 100% 32M   Ultrasound Goals Pain   Iontophoresis   Type of Iontophoresis Dexamethasone   Location Lateral knee in area of tenderness   Dose '4mg'$ /ml , 1cc   Time 4-6 hour/ 6 minutes                  PT Short Term Goals - 10/04/15 0754    PT SHORT TERM GOAL #1   Title She will be independent with inital HEP   Status Achieved   PT SHORT TERM GOAL #2   Title She will report pain decr 30% or more with wlaking   Status Achieved           PT Long Term Goals - 10/17/15 1715     PT LONG TERM GOAL #1   Title She wil be able to demo all HEP issued   Status On-going   PT LONG TERM GOAL #2   Title She will report 75% improvement with pain and able to walk and sit with no pain   Status Achieved   PT LONG TERM GOAL #3   Title she will be able to walk steps at work with 1-2/10 max pain.   Baseline able to do over the weekend, not sure if consistant   Status Partially Met   PT LONG TERM GOAL #4   Title She will  be able to rise and sit from chair with no pain.    Status Achieved   PT LONG TERM GOAL #5   Title She will be able to flex and extend Lt knee without pain with good speed.    Status Partially Met               Plan - 10/17/15 1714    Clinical Impression Statement (90-95% better and she will consider discharge next week dependening on if she has pain over week  end   PT Next Visit Plan Continue Ionto , manual , [place] , Review HEP   Consulted and Agree with Plan of Care Patient      Patient will benefit from skilled therapeutic intervention in order to improve the following deficits and impairments:  Pain, Decreased mobility, Decreased activity tolerance, Difficulty walking, Decreased strength  Visit Diagnosis: Difficulty in walking, not elsewhere classified  Pain in left knee  Muscle weakness (generalized)     Problem List There are no active problems to display for this patient.   Darrel Hoover PT 10/17/2015, 5:16 PM  [facility] [address] [place], [place], [address] Phone: [phone]   Fax:  [phone]  Name: Pamela Monroe MRN: [id number] Date of Birth: 10-20-1960   PHYSICAL THERAPY DISCHARGE SUMMARY  Visits from Start of Care: 7  Current functional level related to goals / functional outcomes:      Unsure She did not return   Remaining deficits: Unknown   Education / Equipment: HEP Plan:                                                    Patient goals were  partially met. Patient is being discharged due to not returning since the last visit.  ?????    Pamela Monroe  Pamela Monroe   PT  11/9 /17         3:31 PM

## 2015-10-21 ENCOUNTER — Ambulatory Visit: Payer: 59 | Admitting: Physical Therapy

## 2015-10-24 ENCOUNTER — Encounter: Payer: 59 | Admitting: Physical Therapy

## 2015-10-25 DIAGNOSIS — H524 Presbyopia: Secondary | ICD-10-CM | POA: Diagnosis not present

## 2015-10-25 DIAGNOSIS — H5203 Hypermetropia, bilateral: Secondary | ICD-10-CM | POA: Diagnosis not present

## 2015-10-25 DIAGNOSIS — H52223 Regular astigmatism, bilateral: Secondary | ICD-10-CM | POA: Diagnosis not present

## 2016-01-09 DIAGNOSIS — H04123 Dry eye syndrome of bilateral lacrimal glands: Secondary | ICD-10-CM | POA: Diagnosis not present

## 2016-01-09 DIAGNOSIS — H40013 Open angle with borderline findings, low risk, bilateral: Secondary | ICD-10-CM | POA: Diagnosis not present

## 2016-01-09 DIAGNOSIS — H40033 Anatomical narrow angle, bilateral: Secondary | ICD-10-CM | POA: Diagnosis not present

## 2016-01-09 DIAGNOSIS — D3132 Benign neoplasm of left choroid: Secondary | ICD-10-CM | POA: Diagnosis not present

## 2016-01-09 DIAGNOSIS — H25093 Other age-related incipient cataract, bilateral: Secondary | ICD-10-CM | POA: Diagnosis not present

## 2016-01-16 MED FILL — valACYclovir HCL 1 GM TABS: 1 | 8 days supply | Qty: 30 | Fill #2

## 2016-05-19 MED FILL — valACYclovir HCL 1 GM TABS: 1 | 7 days supply | Qty: 30 | Fill #0

## 2016-06-09 DIAGNOSIS — Z23 Encounter for immunization: Secondary | ICD-10-CM | POA: Diagnosis not present

## 2016-06-09 DIAGNOSIS — Z1231 Encounter for screening mammogram for malignant neoplasm of breast: Secondary | ICD-10-CM | POA: Diagnosis not present

## 2016-06-09 DIAGNOSIS — Z6831 Body mass index (BMI) 31.0-31.9, adult: Secondary | ICD-10-CM | POA: Diagnosis not present

## 2016-06-09 DIAGNOSIS — Z01419 Encounter for gynecological examination (general) (routine) without abnormal findings: Secondary | ICD-10-CM | POA: Diagnosis not present

## 2016-06-29 DIAGNOSIS — L814 Other melanin hyperpigmentation: Secondary | ICD-10-CM | POA: Diagnosis not present

## 2016-06-29 DIAGNOSIS — D225 Melanocytic nevi of trunk: Secondary | ICD-10-CM | POA: Diagnosis not present

## 2016-06-29 DIAGNOSIS — L821 Other seborrheic keratosis: Secondary | ICD-10-CM | POA: Diagnosis not present

## 2016-06-29 DIAGNOSIS — D1801 Hemangioma of skin and subcutaneous tissue: Secondary | ICD-10-CM | POA: Diagnosis not present

## 2016-09-17 DIAGNOSIS — Z0000 Encounter for general adult medical examination without abnormal findings: Secondary | ICD-10-CM | POA: Diagnosis not present

## 2016-09-17 DIAGNOSIS — E559 Vitamin D deficiency, unspecified: Secondary | ICD-10-CM | POA: Diagnosis not present

## 2016-09-17 DIAGNOSIS — I493 Ventricular premature depolarization: Secondary | ICD-10-CM | POA: Diagnosis not present

## 2016-09-17 MED FILL — FLUCONAZOLE 150 MG TABLET: 150 | 1 days supply | Qty: 1 | Fill #0

## 2016-11-12 MED FILL — NAPROXEN 500 MG TABLET: 500 | 30 days supply | Qty: 60 | Fill #0

## 2016-11-24 DIAGNOSIS — H52223 Regular astigmatism, bilateral: Secondary | ICD-10-CM | POA: Diagnosis not present

## 2016-11-24 DIAGNOSIS — H524 Presbyopia: Secondary | ICD-10-CM | POA: Diagnosis not present

## 2016-11-24 DIAGNOSIS — H5203 Hypermetropia, bilateral: Secondary | ICD-10-CM | POA: Diagnosis not present

## 2016-12-30 MED FILL — IBUPROFEN 600 MG TABLET: 600 | 4 days supply | Qty: 16 | Fill #0

## 2016-12-31 DIAGNOSIS — H04123 Dry eye syndrome of bilateral lacrimal glands: Secondary | ICD-10-CM | POA: Diagnosis not present

## 2016-12-31 DIAGNOSIS — H40033 Anatomical narrow angle, bilateral: Secondary | ICD-10-CM | POA: Diagnosis not present

## 2016-12-31 DIAGNOSIS — H40013 Open angle with borderline findings, low risk, bilateral: Secondary | ICD-10-CM | POA: Diagnosis not present

## 2016-12-31 DIAGNOSIS — D3132 Benign neoplasm of left choroid: Secondary | ICD-10-CM | POA: Diagnosis not present

## 2016-12-31 DIAGNOSIS — H2513 Age-related nuclear cataract, bilateral: Secondary | ICD-10-CM | POA: Diagnosis not present

## 2017-03-24 DIAGNOSIS — N6489 Other specified disorders of breast: Secondary | ICD-10-CM | POA: Diagnosis not present

## 2017-03-25 ENCOUNTER — Other Ambulatory Visit: Payer: Self-pay | Admitting: Obstetrics and Gynecology

## 2017-03-25 DIAGNOSIS — N6489 Other specified disorders of breast: Secondary | ICD-10-CM

## 2017-03-29 ENCOUNTER — Ambulatory Visit: Payer: 59

## 2017-03-29 ENCOUNTER — Ambulatory Visit
Admission: RE | Admit: 2017-03-29 | Discharge: 2017-03-29 | Disposition: A | Discharge status: Home / Self Care | Payer: 59 | Source: Ambulatory Visit | Attending: Obstetrics and Gynecology | Admitting: Obstetrics and Gynecology

## 2017-03-29 ENCOUNTER — Ambulatory Visit: Admission: RE | Admit: 2017-03-29 | Payer: 59 | Source: Ambulatory Visit

## 2017-03-29 DIAGNOSIS — N6489 Other specified disorders of breast: Secondary | ICD-10-CM

## 2017-03-29 DIAGNOSIS — R922 Inconclusive mammogram: Secondary | ICD-10-CM | POA: Diagnosis not present

## 2017-03-31 ENCOUNTER — Other Ambulatory Visit: Payer: 59

## 2017-04-12 MED FILL — AZITHROMYCIN 250 MG TABLET: 250 | 5 days supply | Qty: 6 | Fill #0

## 2017-06-15 DIAGNOSIS — Z01419 Encounter for gynecological examination (general) (routine) without abnormal findings: Secondary | ICD-10-CM | POA: Diagnosis not present

## 2017-06-15 DIAGNOSIS — Z6833 Body mass index (BMI) 33.0-33.9, adult: Secondary | ICD-10-CM | POA: Diagnosis not present

## 2017-06-29 DIAGNOSIS — R1903 Right lower quadrant abdominal swelling, mass and lump: Secondary | ICD-10-CM | POA: Diagnosis not present

## 2017-07-05 DIAGNOSIS — L821 Other seborrheic keratosis: Secondary | ICD-10-CM | POA: Diagnosis not present

## 2017-07-05 DIAGNOSIS — D225 Melanocytic nevi of trunk: Secondary | ICD-10-CM | POA: Diagnosis not present

## 2017-07-15 DIAGNOSIS — H2513 Age-related nuclear cataract, bilateral: Secondary | ICD-10-CM | POA: Diagnosis not present

## 2017-07-15 DIAGNOSIS — H40033 Anatomical narrow angle, bilateral: Secondary | ICD-10-CM | POA: Diagnosis not present

## 2017-07-28 MED FILL — FLUCONAZOLE 150 MG TABLET: 150 | 1 days supply | Qty: 1 | Fill #1

## 2017-08-24 MED FILL — CLOTRIMAZOLE-BETAMETHASONE: 1-0.05 | 15 days supply | Qty: 45 | Fill #0

## 2017-10-20 DIAGNOSIS — Z136 Encounter for screening for cardiovascular disorders: Secondary | ICD-10-CM | POA: Diagnosis not present

## 2017-10-20 DIAGNOSIS — Z131 Encounter for screening for diabetes mellitus: Secondary | ICD-10-CM | POA: Diagnosis not present

## 2017-10-20 DIAGNOSIS — I493 Ventricular premature depolarization: Secondary | ICD-10-CM | POA: Diagnosis not present

## 2017-10-20 DIAGNOSIS — E559 Vitamin D deficiency, unspecified: Secondary | ICD-10-CM | POA: Diagnosis not present

## 2017-10-20 DIAGNOSIS — Z0000 Encounter for general adult medical examination without abnormal findings: Secondary | ICD-10-CM | POA: Diagnosis not present

## 2018-03-14 DIAGNOSIS — J029 Acute pharyngitis, unspecified: Secondary | ICD-10-CM | POA: Diagnosis not present

## 2018-03-14 DIAGNOSIS — R509 Fever, unspecified: Secondary | ICD-10-CM | POA: Diagnosis not present

## 2018-03-14 DIAGNOSIS — M545 Low back pain: Secondary | ICD-10-CM | POA: Diagnosis not present

## 2018-03-14 DIAGNOSIS — R8290 Unspecified abnormal findings in urine: Secondary | ICD-10-CM | POA: Diagnosis not present

## 2018-03-16 MED FILL — CIPROFLOXACIN HCL 500 MG TA: 500 | 5 days supply | Qty: 10 | Fill #0

## 2018-03-16 MED FILL — FLUCONAZOLE 150 MG TABS: 150 | 1 days supply | Qty: 1 | Fill #0

## 2018-03-16 MED FILL — AZITHROMYCIN 250 MG TABLET: 250 | 5 days supply | Qty: 6 | Fill #0

## 2018-06-21 DIAGNOSIS — Z1231 Encounter for screening mammogram for malignant neoplasm of breast: Secondary | ICD-10-CM | POA: Diagnosis not present

## 2018-06-21 DIAGNOSIS — Z6829 Body mass index (BMI) 29.0-29.9, adult: Secondary | ICD-10-CM | POA: Diagnosis not present

## 2018-06-21 DIAGNOSIS — Z01419 Encounter for gynecological examination (general) (routine) without abnormal findings: Secondary | ICD-10-CM | POA: Diagnosis not present

## 2018-06-21 DIAGNOSIS — Z808 Family history of malignant neoplasm of other organs or systems: Secondary | ICD-10-CM | POA: Diagnosis not present

## 2018-06-21 DIAGNOSIS — Z809 Family history of malignant neoplasm, unspecified: Secondary | ICD-10-CM | POA: Diagnosis not present

## 2018-07-12 DIAGNOSIS — L821 Other seborrheic keratosis: Secondary | ICD-10-CM | POA: Diagnosis not present

## 2018-07-12 DIAGNOSIS — D225 Melanocytic nevi of trunk: Secondary | ICD-10-CM | POA: Diagnosis not present

## 2018-07-12 DIAGNOSIS — D1801 Hemangioma of skin and subcutaneous tissue: Secondary | ICD-10-CM | POA: Diagnosis not present

## 2018-07-12 DIAGNOSIS — D2371 Other benign neoplasm of skin of right lower limb, including hip: Secondary | ICD-10-CM | POA: Diagnosis not present

## 2018-08-24 MED FILL — valACYclovir HCL 1 GM TABS: 1 | 15 days supply | Qty: 30 | Fill #0

## 2018-10-26 DIAGNOSIS — Z1159 Encounter for screening for other viral diseases: Secondary | ICD-10-CM | POA: Diagnosis not present

## 2018-10-26 DIAGNOSIS — I493 Ventricular premature depolarization: Secondary | ICD-10-CM | POA: Diagnosis not present

## 2018-10-26 DIAGNOSIS — E559 Vitamin D deficiency, unspecified: Secondary | ICD-10-CM | POA: Diagnosis not present

## 2018-10-26 DIAGNOSIS — Z13220 Encounter for screening for lipoid disorders: Secondary | ICD-10-CM | POA: Diagnosis not present

## 2018-10-26 DIAGNOSIS — M8588 Other specified disorders of bone density and structure, other site: Secondary | ICD-10-CM | POA: Diagnosis not present

## 2018-10-26 DIAGNOSIS — Z0000 Encounter for general adult medical examination without abnormal findings: Secondary | ICD-10-CM | POA: Diagnosis not present

## 2018-12-14 DIAGNOSIS — H52223 Regular astigmatism, bilateral: Secondary | ICD-10-CM | POA: Diagnosis not present

## 2018-12-14 DIAGNOSIS — H5203 Hypermetropia, bilateral: Secondary | ICD-10-CM | POA: Diagnosis not present

## 2019-03-13 MED FILL — valACYclovir HCL 1 GM TABS: 1 | 15 days supply | Qty: 30 | Fill #0

## 2019-04-03 MED FILL — CLOTRIMAZOLE-BETAMETHASONE: 1-0.05 | 7 days supply | Qty: 15 | Fill #0

## 2019-04-03 MED FILL — FLUCONAZOLE 150 MG TABLET: 150 | 1 days supply | Qty: 1 | Fill #0

## 2019-06-06 DIAGNOSIS — Z20828 Contact with and (suspected) exposure to other viral communicable diseases: Secondary | ICD-10-CM | POA: Diagnosis not present

## 2019-06-28 DIAGNOSIS — Z01419 Encounter for gynecological examination (general) (routine) without abnormal findings: Secondary | ICD-10-CM | POA: Diagnosis not present

## 2019-06-28 DIAGNOSIS — Z6831 Body mass index (BMI) 31.0-31.9, adult: Secondary | ICD-10-CM | POA: Diagnosis not present

## 2019-06-28 DIAGNOSIS — Z1231 Encounter for screening mammogram for malignant neoplasm of breast: Secondary | ICD-10-CM | POA: Diagnosis not present

## 2019-07-18 DIAGNOSIS — Z13820 Encounter for screening for osteoporosis: Secondary | ICD-10-CM | POA: Diagnosis not present

## 2019-07-19 DIAGNOSIS — L905 Scar conditions and fibrosis of skin: Secondary | ICD-10-CM | POA: Diagnosis not present

## 2019-07-19 DIAGNOSIS — D2261 Melanocytic nevi of right upper limb, including shoulder: Secondary | ICD-10-CM | POA: Diagnosis not present

## 2019-07-19 DIAGNOSIS — D2262 Melanocytic nevi of left upper limb, including shoulder: Secondary | ICD-10-CM | POA: Diagnosis not present

## 2019-07-19 DIAGNOSIS — D235 Other benign neoplasm of skin of trunk: Secondary | ICD-10-CM | POA: Diagnosis not present

## 2019-07-19 DIAGNOSIS — L821 Other seborrheic keratosis: Secondary | ICD-10-CM | POA: Diagnosis not present

## 2019-07-19 DIAGNOSIS — L814 Other melanin hyperpigmentation: Secondary | ICD-10-CM | POA: Diagnosis not present

## 2019-07-19 DIAGNOSIS — L0109 Other impetigo: Secondary | ICD-10-CM | POA: Diagnosis not present

## 2019-08-03 MED FILL — ONDANSETRON HCL 4 MG TABLET: 4 | 8 days supply | Qty: 30 | Fill #0

## 2019-08-29 DIAGNOSIS — D3132 Benign neoplasm of left choroid: Secondary | ICD-10-CM | POA: Diagnosis not present

## 2019-08-29 DIAGNOSIS — H2513 Age-related nuclear cataract, bilateral: Secondary | ICD-10-CM | POA: Diagnosis not present

## 2019-08-29 DIAGNOSIS — H40033 Anatomical narrow angle, bilateral: Secondary | ICD-10-CM | POA: Diagnosis not present

## 2019-08-29 DIAGNOSIS — H40013 Open angle with borderline findings, low risk, bilateral: Secondary | ICD-10-CM | POA: Diagnosis not present

## 2019-09-11 DIAGNOSIS — H40013 Open angle with borderline findings, low risk, bilateral: Secondary | ICD-10-CM | POA: Diagnosis not present

## 2019-09-11 DIAGNOSIS — H04123 Dry eye syndrome of bilateral lacrimal glands: Secondary | ICD-10-CM | POA: Diagnosis not present

## 2019-09-11 DIAGNOSIS — H2513 Age-related nuclear cataract, bilateral: Secondary | ICD-10-CM | POA: Diagnosis not present

## 2019-09-11 DIAGNOSIS — H40033 Anatomical narrow angle, bilateral: Secondary | ICD-10-CM | POA: Diagnosis not present

## 2019-09-25 DIAGNOSIS — H40033 Anatomical narrow angle, bilateral: Secondary | ICD-10-CM | POA: Diagnosis not present

## 2019-09-25 MED FILL — PREDNISOLONE AC 1% EYE DROP: 1 | 7 days supply | Qty: 5 | Fill #0

## 2019-10-18 DIAGNOSIS — H40033 Anatomical narrow angle, bilateral: Secondary | ICD-10-CM | POA: Diagnosis not present

## 2019-10-31 DIAGNOSIS — E559 Vitamin D deficiency, unspecified: Secondary | ICD-10-CM | POA: Diagnosis not present

## 2019-10-31 DIAGNOSIS — Z13220 Encounter for screening for lipoid disorders: Secondary | ICD-10-CM | POA: Diagnosis not present

## 2019-10-31 DIAGNOSIS — R030 Elevated blood-pressure reading, without diagnosis of hypertension: Secondary | ICD-10-CM | POA: Diagnosis not present

## 2019-10-31 DIAGNOSIS — Z131 Encounter for screening for diabetes mellitus: Secondary | ICD-10-CM | POA: Diagnosis not present

## 2019-10-31 DIAGNOSIS — Z0000 Encounter for general adult medical examination without abnormal findings: Secondary | ICD-10-CM | POA: Diagnosis not present

## 2019-10-31 DIAGNOSIS — M8588 Other specified disorders of bone density and structure, other site: Secondary | ICD-10-CM | POA: Diagnosis not present

## 2019-11-06 MED FILL — FLUCONAZOLE 150 MG TABS: 150 | 1 days supply | Qty: 1 | Fill #1

## 2019-11-14 DIAGNOSIS — R05 Cough: Secondary | ICD-10-CM | POA: Diagnosis not present

## 2019-11-14 DIAGNOSIS — J069 Acute upper respiratory infection, unspecified: Secondary | ICD-10-CM | POA: Diagnosis not present

## 2019-11-14 MED FILL — PROMETHAZINE W/COD SYRUP: 6.25-10 | 6 days supply | Qty: 120 | Fill #0

## 2019-11-29 DIAGNOSIS — H40033 Anatomical narrow angle, bilateral: Secondary | ICD-10-CM | POA: Diagnosis not present

## 2019-11-29 DIAGNOSIS — H40013 Open angle with borderline findings, low risk, bilateral: Secondary | ICD-10-CM | POA: Diagnosis not present

## 2020-07-02 DIAGNOSIS — Z6833 Body mass index (BMI) 33.0-33.9, adult: Secondary | ICD-10-CM | POA: Diagnosis not present

## 2020-07-02 DIAGNOSIS — Z01419 Encounter for gynecological examination (general) (routine) without abnormal findings: Secondary | ICD-10-CM | POA: Diagnosis not present

## 2020-07-08 ENCOUNTER — Other Ambulatory Visit: Payer: Self-pay | Admitting: Obstetrics and Gynecology

## 2020-07-08 DIAGNOSIS — R928 Other abnormal and inconclusive findings on diagnostic imaging of breast: Secondary | ICD-10-CM

## 2020-07-10 ENCOUNTER — Other Ambulatory Visit: Payer: Self-pay

## 2020-07-10 ENCOUNTER — Ambulatory Visit: Payer: 59

## 2020-07-10 ENCOUNTER — Ambulatory Visit
Admission: RE | Admit: 2020-07-10 | Discharge: 2020-07-10 | Disposition: A | Discharge status: Home / Self Care | Payer: 59 | Source: Ambulatory Visit | Attending: Obstetrics and Gynecology | Admitting: Obstetrics and Gynecology

## 2020-07-10 ENCOUNTER — Other Ambulatory Visit: Payer: 59

## 2020-07-10 DIAGNOSIS — R928 Other abnormal and inconclusive findings on diagnostic imaging of breast: Secondary | ICD-10-CM

## 2020-07-13 ENCOUNTER — Other Ambulatory Visit: Payer: 59

## 2020-07-22 DIAGNOSIS — D1801 Hemangioma of skin and subcutaneous tissue: Secondary | ICD-10-CM | POA: Diagnosis not present

## 2020-07-22 DIAGNOSIS — D2261 Melanocytic nevi of right upper limb, including shoulder: Secondary | ICD-10-CM | POA: Diagnosis not present

## 2020-07-22 DIAGNOSIS — D225 Melanocytic nevi of trunk: Secondary | ICD-10-CM | POA: Diagnosis not present

## 2020-07-22 DIAGNOSIS — D2262 Melanocytic nevi of left upper limb, including shoulder: Secondary | ICD-10-CM | POA: Diagnosis not present

## 2020-07-22 DIAGNOSIS — L821 Other seborrheic keratosis: Secondary | ICD-10-CM | POA: Diagnosis not present

## 2020-07-22 DIAGNOSIS — L814 Other melanin hyperpigmentation: Secondary | ICD-10-CM | POA: Diagnosis not present

## 2020-07-23 ENCOUNTER — Other Ambulatory Visit: Payer: 59

## 2020-08-15 ENCOUNTER — Other Ambulatory Visit (HOSPITAL_COMMUNITY): Payer: Self-pay

## 2020-08-15 MED ORDER — ONDANSETRON 8 MG PO TBDP
8.0000 mg | ORAL_TABLET | Freq: Two times a day (BID) | ORAL | 1 refills | Status: AC
  Filled 2020-08-15: qty 30, 15d supply, fill #0

## 2020-09-24 DIAGNOSIS — H40033 Anatomical narrow angle, bilateral: Secondary | ICD-10-CM | POA: Diagnosis not present

## 2020-09-24 DIAGNOSIS — H04123 Dry eye syndrome of bilateral lacrimal glands: Secondary | ICD-10-CM | POA: Diagnosis not present

## 2020-09-24 DIAGNOSIS — D4981 Neoplasm of unspecified behavior of retina and choroid: Secondary | ICD-10-CM | POA: Diagnosis not present

## 2020-09-24 DIAGNOSIS — H40013 Open angle with borderline findings, low risk, bilateral: Secondary | ICD-10-CM | POA: Diagnosis not present

## 2020-09-24 DIAGNOSIS — H2513 Age-related nuclear cataract, bilateral: Secondary | ICD-10-CM | POA: Diagnosis not present

## 2020-10-31 DIAGNOSIS — I493 Ventricular premature depolarization: Secondary | ICD-10-CM | POA: Diagnosis not present

## 2020-10-31 DIAGNOSIS — Z13220 Encounter for screening for lipoid disorders: Secondary | ICD-10-CM | POA: Diagnosis not present

## 2020-10-31 DIAGNOSIS — M8588 Other specified disorders of bone density and structure, other site: Secondary | ICD-10-CM | POA: Diagnosis not present

## 2020-10-31 DIAGNOSIS — Z0000 Encounter for general adult medical examination without abnormal findings: Secondary | ICD-10-CM | POA: Diagnosis not present

## 2020-10-31 DIAGNOSIS — E559 Vitamin D deficiency, unspecified: Secondary | ICD-10-CM | POA: Diagnosis not present

## 2020-11-14 DIAGNOSIS — M79672 Pain in left foot: Secondary | ICD-10-CM | POA: Diagnosis not present

## 2020-11-14 DIAGNOSIS — M7732 Calcaneal spur, left foot: Secondary | ICD-10-CM | POA: Diagnosis not present

## 2021-05-03 IMAGING — MG MM DIGITAL DIAGNOSTIC UNILAT*L* W/ TOMO W/ CAD
6 series · 6 of 18 positions shown · non-contrast
Comparison: Previous exam(s).

CLINICAL DATA: 59-year-old female for further evaluation of
possible LEFT breast mass on screening mammogram.

EXAM:
DIGITAL DIAGNOSTIC UNILATERAL LEFT MAMMOGRAM WITH TOMOSYNTHESIS AND
CAD
TECHNIQUE: Left digital diagnostic mammography and breast tomosynthesis was
performed. The images were evaluated with computer-aided detection.

[L MLO synth-2D (1 of 2)]
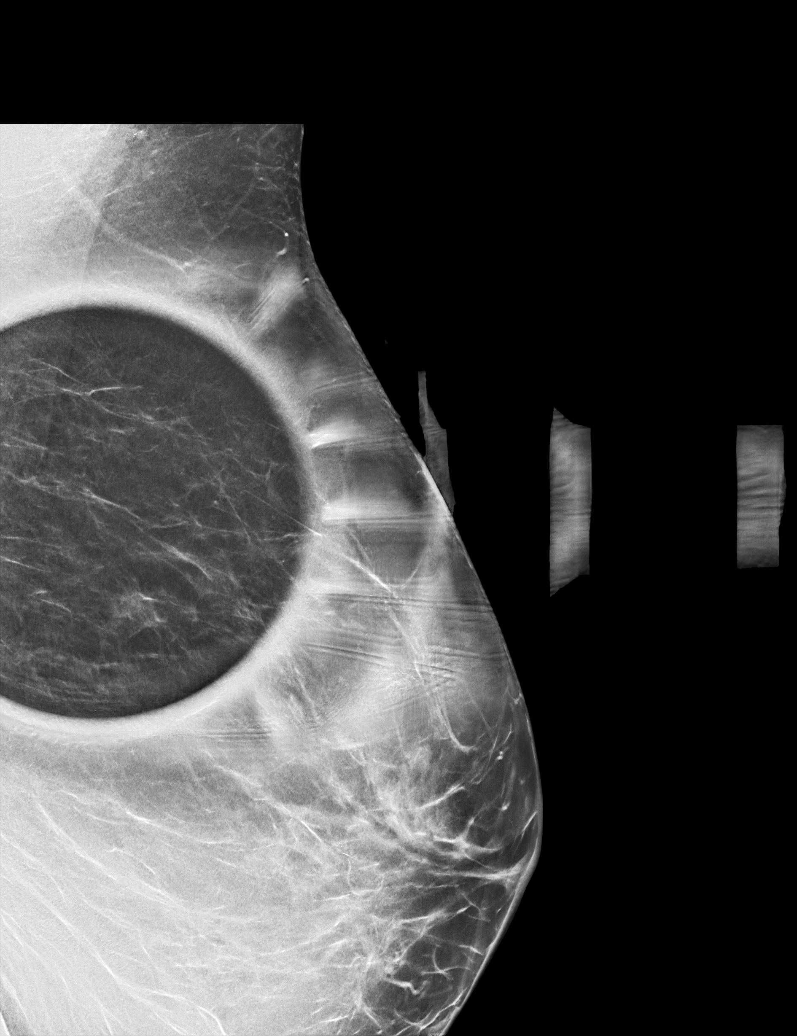

[L CC synth-2D]
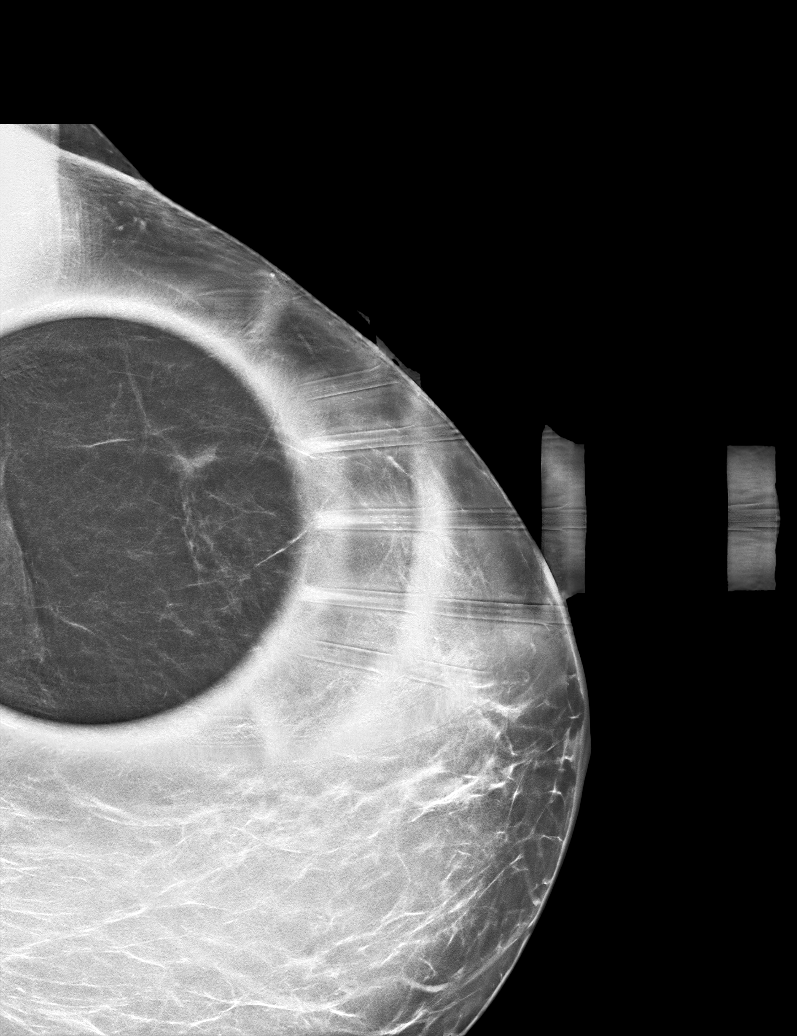

[L MLO synth-2D (2 of 2)]
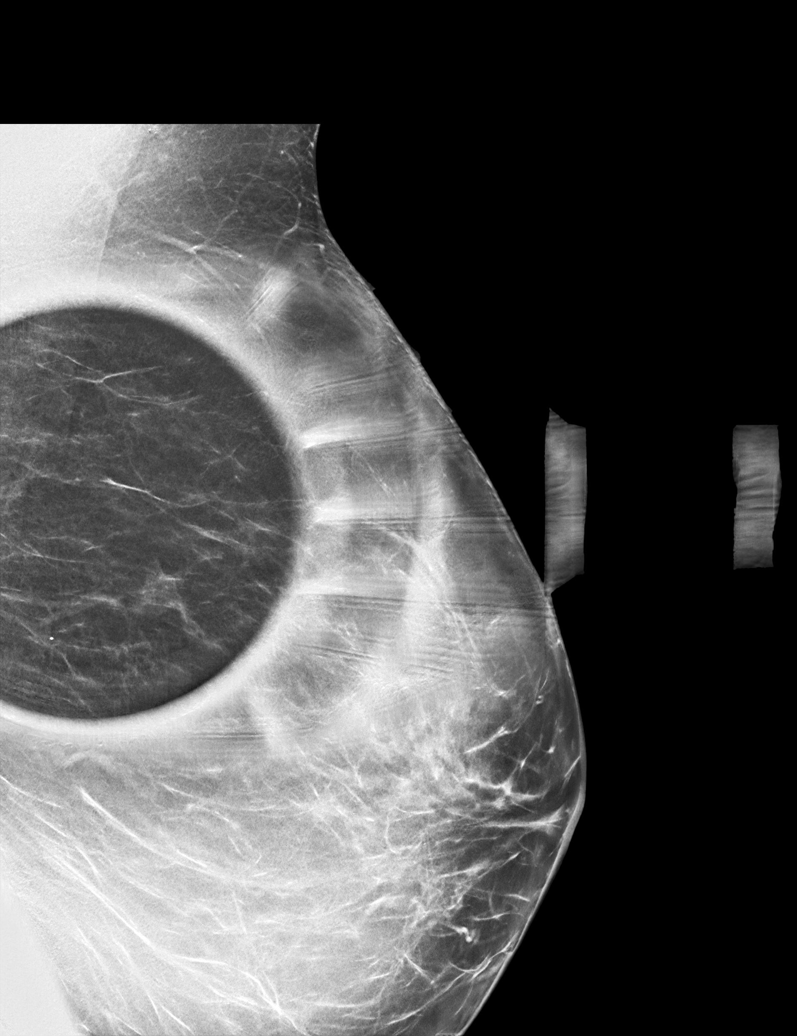

[L MLO tomo (1 of 2) · tomo slice 33/64.0]
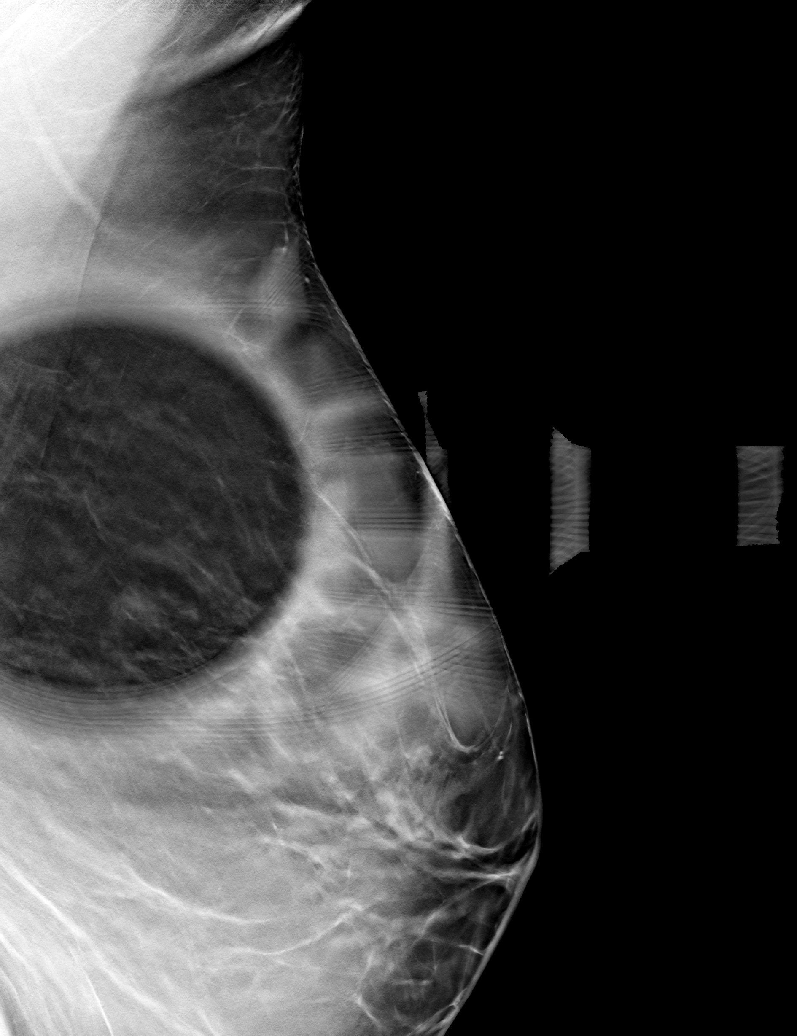

[L MLO tomo (2 of 2) · tomo slice 33/65.0]
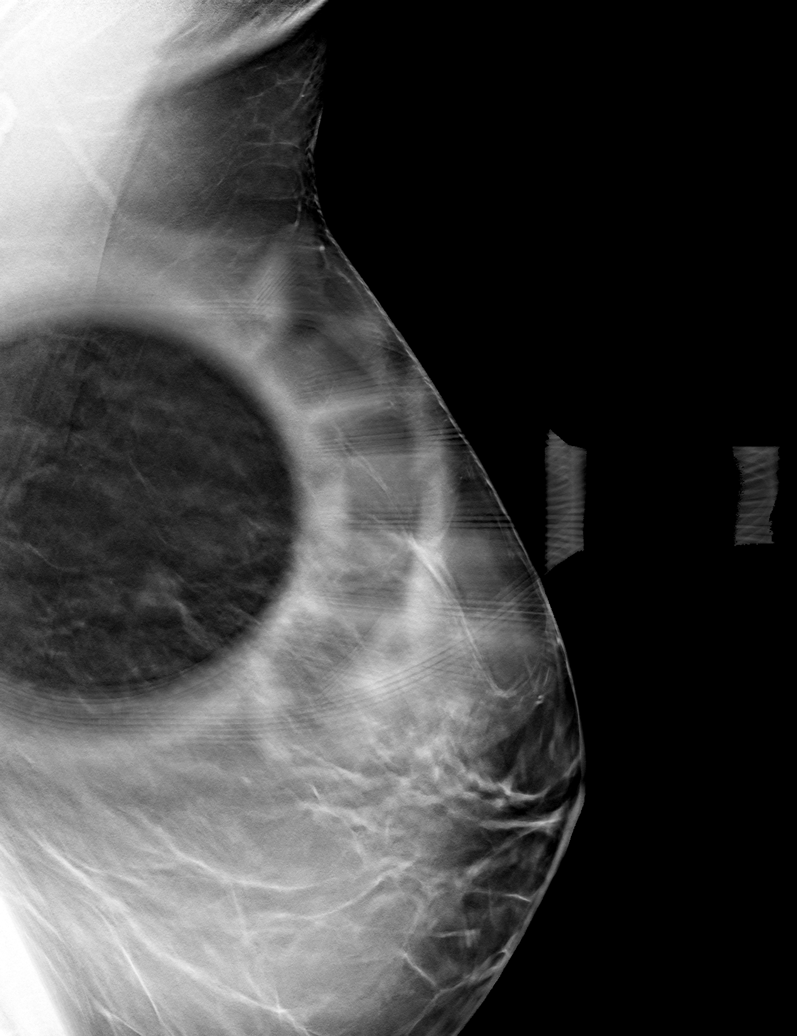

[L CC tomo · tomo slice 31/61.0]
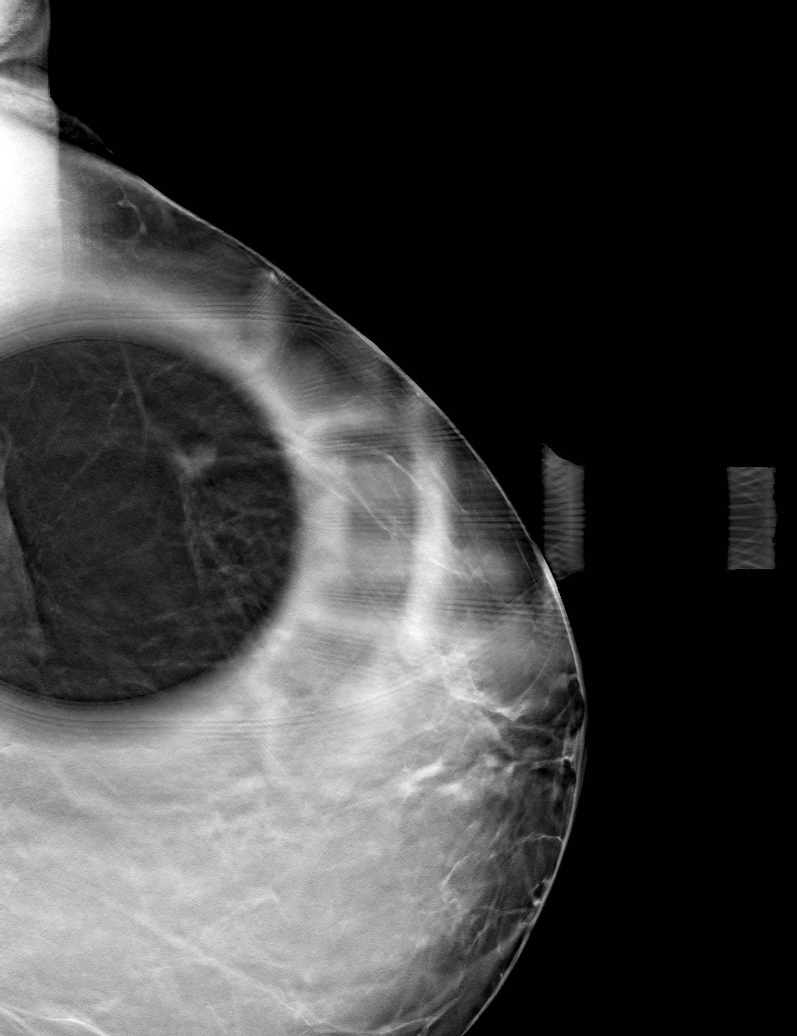

[6 of 18 positions shown; findings below may reference images not displayed]

ACR Breast Density Category b: There are scattered areas of
fibroglandular density.
FINDINGS: 2D/3D spot compression views of the LEFT breast demonstrate
dispersal of the density within the posterior UPPER OUTER LEFT
breast now with appearance of a subtle asymmetry. On today's images,
however this area has an improved appearance from remote studies
dating back to 2997.
IMPRESSION: 1. No persistent suspicious abnormality at the site of the screening
study finding.

RECOMMENDATION:
Bilateral screening mammogram in 1 year.

I have discussed the findings and recommendations with the patient.
If applicable, a reminder letter will be sent to the patient
regarding the next appointment.

BI-RADS CATEGORY  2: Benign.

## 2021-07-03 ENCOUNTER — Other Ambulatory Visit (HOSPITAL_COMMUNITY): Payer: Self-pay

## 2021-07-03 MED ORDER — VALACYCLOVIR HCL 1 G PO TABS
1000.0000 mg | ORAL_TABLET | Freq: Every day | ORAL | 5 refills | Status: AC
  Filled 2021-07-03: qty 30, 30d supply, fill #0

## 2021-07-11 ENCOUNTER — Other Ambulatory Visit (HOSPITAL_COMMUNITY): Payer: Self-pay

## 2022-08-24 ENCOUNTER — Other Ambulatory Visit: Payer: Self-pay | Admitting: Obstetrics and Gynecology

## 2022-08-24 DIAGNOSIS — Z8249 Family history of ischemic heart disease and other diseases of the circulatory system: Secondary | ICD-10-CM

## 2022-08-25 ENCOUNTER — Other Ambulatory Visit: Payer: Self-pay | Admitting: Obstetrics and Gynecology

## 2022-08-25 DIAGNOSIS — Z800 Family history of malignant neoplasm of digestive organs: Secondary | ICD-10-CM

## 2022-09-28 ENCOUNTER — Ambulatory Visit
Admission: RE | Admit: 2022-09-28 | Discharge: 2022-09-28 | Disposition: A | Discharge status: Home / Self Care | Payer: Self-pay | Source: Ambulatory Visit | Attending: Obstetrics and Gynecology | Admitting: Obstetrics and Gynecology

## 2022-09-28 DIAGNOSIS — Z800 Family history of malignant neoplasm of digestive organs: Secondary | ICD-10-CM

## 2022-09-28 DIAGNOSIS — Z8249 Family history of ischemic heart disease and other diseases of the circulatory system: Secondary | ICD-10-CM

## 2022-09-28 MED ORDER — GADOPICLENOL 0.5 MMOL/ML IV SOLN
9.0000 mL | Freq: Once | INTRAVENOUS | Status: AC | PRN
  Administered 2022-09-28: 9 mL via INTRAVENOUS

## 2022-10-24 ENCOUNTER — Other Ambulatory Visit (HOSPITAL_COMMUNITY): Payer: Self-pay

## 2022-11-25 ENCOUNTER — Ambulatory Visit: Payer: Self-pay | Attending: Cardiology | Admitting: Cardiology

## 2022-11-25 ENCOUNTER — Encounter: Payer: Self-pay | Admitting: Cardiology

## 2022-11-25 ENCOUNTER — Telehealth: Payer: Self-pay | Admitting: Licensed Clinical Social Worker

## 2022-11-25 VITALS — BP 149/89 | HR 79 | Ht 64.0 in | Wt 194.2 lb

## 2022-11-25 DIAGNOSIS — K760 Fatty (change of) liver, not elsewhere classified: Secondary | ICD-10-CM

## 2022-11-25 DIAGNOSIS — I2510 Atherosclerotic heart disease of native coronary artery without angina pectoris: Secondary | ICD-10-CM

## 2022-11-25 DIAGNOSIS — R030 Elevated blood-pressure reading, without diagnosis of hypertension: Secondary | ICD-10-CM

## 2022-11-25 DIAGNOSIS — R7303 Prediabetes: Secondary | ICD-10-CM

## 2022-11-25 DIAGNOSIS — E783 Hyperchylomicronemia: Secondary | ICD-10-CM

## 2022-11-25 DIAGNOSIS — R011 Cardiac murmur, unspecified: Secondary | ICD-10-CM

## 2022-11-25 MED ORDER — ATORVASTATIN CALCIUM 10 MG PO TABS: 10.0000 mg | ORAL_TABLET | Freq: Every day | ORAL | 3 refills | Status: DC

## 2022-11-25 NOTE — Telephone Encounter (Signed)
H&V Care Navigation CSW Progress Note  Clinical Social Worker contacted patient by phone to f/u after appt today with Dr. Servando Salina as no insurance listed. No answer at [phone]. Left voicemail requesting call back. Will re-attempt again as able.   Patient is participating in a Managed Medicaid Plan:  No, no insurance on file.   SDOH Screenings   Tobacco Use: Low Risk  (11/25/2022)   Octavio Graves, MSW, LCSW Clinical Social Worker II [facility] Heart/Vascular Care Navigation  [phone]- work cell phone (preferred) [phone]- desk phone

## 2022-11-25 NOTE — Patient Instructions (Addendum)
Medication Instructions:  Your physician has recommended you make the following change in your medication: Lipitor(atorvastatin) 10 mg   *If you need a refill on your cardiac medications before your next appointment, please call your pharmacy*   Lab Work: None needed  If you have labs (blood work) drawn today and your tests are completely normal, you will receive your results only by: MyChart Message (if you have MyChart) OR A paper copy in the mail If you have any lab test that is abnormal or we need to change your treatment, we will call you to review the results.   Testing/Procedures: Your physician has requested that you have an echocardiogram. Echocardiography is a painless test that uses sound waves to create images of your heart. It provides your doctor with information about the size and shape of your heart and how well your heart's chambers and valves are working. This procedure takes approximately one hour. There are no restrictions for this procedure. Please do NOT wear cologne, perfume, aftershave, or lotions (deodorant is allowed). Please arrive 15 minutes prior to your appointment time.   Follow-Up: At [facility], you and your health needs are our priority.  As part of our continuing mission to provide you with exceptional heart care, we have created designated Provider Care Teams.  These Care Teams include your primary Cardiologist (physician) and Advanced Practice Providers (APPs -  Physician Assistants and Nurse Practitioners) who all work together to provide you with the care you need, when you need it.  We recommend signing up for the patient portal called "MyChart".  Sign up information is provided on this After Visit Summary.  MyChart is used to connect with patients for Virtual Visits (Telemedicine).  Patients are able to view lab/test results, encounter notes, upcoming appointments, etc.  Non-urgent messages can be sent to your provider as well.   To learn  more about what you can do with MyChart, go to [email or web address].    Your next appointment:   3 month(s)  Provider:   Thomasene Ripple, DO    Other Instructions Order BP cuff brand Omron

## 2022-11-25 NOTE — Progress Notes (Addendum)
Cardiology Office Note:    Date:  11/25/2022   ID:  Pamela Monroe, DOB 11-09-1960, MRN [id number]  PCP:  Maurice Small, MD (Inactive)  Cardiologist:  Thomasene Ripple, DO  Electrophysiologist:  None   Referring MD: Richardean Chimera, MD    " I am ok"  History of Present Illness:    Pamela Monroe is a 62 y.o. female with a hx of coronary calcification seen on CT calcium scoring recently, prediabetes hemoglobin A1c 6.0 which was done Oct 01, 2021, hyperlipidemia LDL 124 her last lab work and obesity.  She was sent by her OB/GYN to be evaluated for coronary calcification.  She has significant family history of premature coronary artery disease her father had his first MI in his 90s.  She offers no complaints today.  She does not have any chest pain or shortness of breath.  Past Medical History:  Diagnosis Date   Osteopenia     Past Surgical History:  Procedure Laterality Date   mixed hyperlipidemia     prediabetes      Current Medications: Current Meds  Medication Sig   atorvastatin (LIPITOR) 10 MG tablet Take 1 tablet (10 mg total) by mouth daily.   cetirizine (ZYRTEC) 5 MG chewable tablet Chew 5 mg by mouth daily.   Cholecalciferol (VITAMIN D3) 50 MCG (2000 UT) CAPS Take 1 capsule by mouth daily.   naproxen sodium (ANAPROX) 220 MG tablet Take 220 mg by mouth 2 (two) times daily with a meal.   ondansetron (ZOFRAN-ODT) 8 MG disintegrating tablet Take 1 tablet (8 mg total) by mouth 2 (two) times daily.   valACYclovir (VALTREX) 1000 MG tablet Take 1 tablet (1,000 mg total) by mouth daily.     Allergies:   Penicillins   Social History   Socioeconomic History   Marital status: Married    Spouse name: Not on file   Number of children: Not on file   Years of education: Not on file   Highest education level: Not on file  Occupational History   Not on file  Tobacco Use   Smoking status: Never   Smokeless tobacco: Never  Substance and Sexual Activity   Alcohol use: Not on  file   Drug use: Not on file   Sexual activity: Not on file  Other Topics Concern   Not on file  Social History Narrative   Not on file   Social Determinants of Health   Financial Resource Strain: Not on file  Food Insecurity: Not on file  Transportation Needs: Not on file  Physical Activity: Not on file  Stress: Not on file  Social Connections: Not on file     Family History: The patient's family history is not on file.  ROS:   Review of Systems  Constitution: Negative for decreased appetite, fever and weight gain.  HENT: Negative for congestion, ear discharge, hoarse voice and sore throat.   Eyes: Negative for discharge, redness, vision loss in right eye and visual halos.  Cardiovascular: Negative for chest pain, dyspnea on exertion, leg swelling, orthopnea and palpitations.  Respiratory: Negative for cough, hemoptysis, shortness of breath and snoring.   Endocrine: Negative for heat intolerance and polyphagia.  Hematologic/Lymphatic: Negative for bleeding problem. Does not bruise/bleed easily.  Skin: Negative for flushing, nail changes, rash and suspicious lesions.  Musculoskeletal: Negative for arthritis, joint pain, muscle cramps, myalgias, neck pain and stiffness.  Gastrointestinal: Negative for abdominal pain, bowel incontinence, diarrhea and excessive appetite.  Genitourinary: Negative for decreased libido,  genital sores and incomplete emptying.  Neurological: Negative for brief paralysis, focal weakness, headaches and loss of balance.  Psychiatric/Behavioral: Negative for altered mental status, depression and suicidal ideas.  Allergic/Immunologic: Negative for HIV exposure and persistent infections.    EKGs/Labs/Other Studies Reviewed:    The following studies were reviewed today:   EKG:  The ekg ordered today demonstrates   Recent Labs: No results found for requested labs within last 365 days.  Recent Lipid Panel No results found for: "CHOL", "TRIG", "HDL",  "CHOLHDL", "VLDL", "LDLCALC", "LDLDIRECT"  Physical Exam:    VS:  BP (!) 149/89 (BP Location: Right Arm, Patient Position: Sitting, Cuff Size: Large)   Pulse 79   Ht 5\' 4"  (1.626 m)   Wt 194 lb 3.2 oz (88.1 kg)   SpO2 97%   BMI 33.33 kg/m     Wt Readings from Last 3 Encounters:  11/25/22 194 lb 3.2 oz (88.1 kg)     GEN: Well nourished, well developed in no acute distress HEENT: Normal NECK: No JVD; No carotid bruits LYMPHATICS: No lymphadenopathy CARDIAC: S1S2 noted,RRR,2/6 mid ejection systolic murmurs, rubs, gallops RESPIRATORY:  Clear to auscultation without rales, wheezing or rhonchi  ABDOMEN: Soft, non-tender, non-distended, +bowel sounds, no guarding. EXTREMITIES: No edema, No cyanosis, no clubbing MUSCULOSKELETAL:  No deformity  SKIN: Warm and dry NEUROLOGIC:  Alert and oriented x 3, non-focal PSYCHIATRIC:  Normal affect, good insight  ASSESSMENT:    1. Coronary artery sclerosis   2. Mixed hyperlipidemia   3. Murmur   4. Prediabetes   5. Hepatic steatosis   6. Elevated blood pressure reading    PLAN:    We discussed her coronary CT imaging result.  Explained to patient about what it means for primary prevention for her coronary calcification.  She is agreeable to start on the low intensity statin Lipitor 10 mg daily.  She has blood work coming up in August with her GYN which would include lipid panel we will follow-up her LDL goals at that point.  LDL should ideally be less than 70.  Her blood pressure is elevated in the office manually also taken by me 142/86 mmHg.  She has never been diagnosed with hypertension.  Shared decision she is going to get a blood pressure cuff take her blood pressure daily and send me updates after 2 weeks.  Should her blood pressure continue to be elevated then I plan to start the patient on antihypertensive medication likely amlodipine 5 mg daily.  The patient understands the need to lose weight with diet and exercise. We have  discussed specific strategies for this.  I counseled the patient about her hemoglobin A1c at 6.0 meaning diagnosis of prediabetes.  Will get an echocardiogram to rule out any valvular abnormalities at this time.  The patient is in agreement with the above plan. The patient left the office in stable condition.  The patient will follow up in 16 weeks or sooner if needed.   Medication Adjustments/Labs and Tests Ordered: Current medicines are reviewed at length with the patient today.  Concerns regarding medicines are outlined above.  Orders Placed This Encounter  Procedures   EKG 12-Lead   ECHOCARDIOGRAM COMPLETE   Meds ordered this encounter  Medications   atorvastatin (LIPITOR) 10 MG tablet    Sig: Take 1 tablet (10 mg total) by mouth daily.    Dispense:  90 tablet    Refill:  3    Patient Instructions  Medication Instructions:  Your physician has recommended  you make the following change in your medication: Lipitor(atorvastatin) 10 mg   *If you need a refill on your cardiac medications before your next appointment, please call your pharmacy*   Lab Work: None needed  If you have labs (blood work) drawn today and your tests are completely normal, you will receive your results only by: MyChart Message (if you have MyChart) OR A paper copy in the mail If you have any lab test that is abnormal or we need to change your treatment, we will call you to review the results.   Testing/Procedures: Your physician has requested that you have an echocardiogram. Echocardiography is a painless test that uses sound waves to create images of your heart. It provides your doctor with information about the size and shape of your heart and how well your heart's chambers and valves are working. This procedure takes approximately one hour. There are no restrictions for this procedure. Please do NOT wear cologne, perfume, aftershave, or lotions (deodorant is allowed). Please arrive 15 minutes prior  to your appointment time.   Follow-Up: At [facility], you and your health needs are our priority.  As part of our continuing mission to provide you with exceptional heart care, we have created designated Provider Care Teams.  These Care Teams include your primary Cardiologist (physician) and Advanced Practice Providers (APPs -  Physician Assistants and Nurse Practitioners) who all work together to provide you with the care you need, when you need it.  We recommend signing up for the patient portal called "MyChart".  Sign up information is provided on this After Visit Summary.  MyChart is used to connect with patients for Virtual Visits (Telemedicine).  Patients are able to view lab/test results, encounter notes, upcoming appointments, etc.  Non-urgent messages can be sent to your provider as well.   To learn more about what you can do with MyChart, go to [email or web address].    Your next appointment:   3 month(s)  Provider:   Thomasene Ripple, DO    Other Instructions Order BP cuff brand Omron    Adopting a Healthy Lifestyle.  Know what a healthy weight is for you (roughly BMI <25) and aim to maintain this   Aim for 7+ servings of fruits and vegetables daily   65-80+ fluid ounces of water or unsweet tea for healthy kidneys   Limit to max 1 drink of alcohol per day; avoid smoking/tobacco   Limit animal fats in diet for cholesterol and heart health - choose grass fed whenever available   Avoid highly processed foods, and foods high in saturated/trans fats   Aim for low stress - take time to unwind and care for your mental health   Aim for 150 min of moderate intensity exercise weekly for heart health, and weights twice weekly for bone health   Aim for 7-9 hours of sleep daily   When it comes to diets, agreement about the perfect plan isnt easy to find, even among the experts. Experts at the [facility] of [employer] developed an idea known as the Healthy Eating  Plate. Just imagine a plate divided into logical, healthy portions.   The emphasis is on diet quality:   Load up on vegetables and fruits - one-half of your plate: Aim for color and variety, and remember that potatoes dont count.   Go for whole grains - one-quarter of your plate: Whole wheat, barley, wheat berries, quinoa, oats, brown rice, and foods made with them. If you want pasta,  go with whole wheat pasta.   Protein power - one-quarter of your plate: Fish, chicken, beans, and nuts are all healthy, versatile protein sources. Limit red meat.   The diet, however, does go beyond the plate, offering a few other suggestions.   Use healthy plant oils, such as olive, canola, soy, corn, sunflower and peanut. Check the labels, and avoid partially hydrogenated oil, which have unhealthy trans fats.   If youre thirsty, drink water. Coffee and tea are good in moderation, but skip sugary drinks and limit milk and dairy products to one or two daily servings.   The type of carbohydrate in the diet is more important than the amount. Some sources of carbohydrates, such as vegetables, fruits, whole grains, and beans-are healthier than others.   Finally, stay active  Signed, Thomasene Ripple, DO  11/25/2022 3:48 PM    [facility]

## 2022-11-26 ENCOUNTER — Telehealth (HOSPITAL_BASED_OUTPATIENT_CLINIC_OR_DEPARTMENT_OTHER): Payer: Self-pay | Admitting: Licensed Clinical Social Worker

## 2022-11-26 ENCOUNTER — Encounter: Payer: Self-pay | Admitting: Cardiology

## 2022-11-26 NOTE — Telephone Encounter (Signed)
H&V Care Navigation CSW Progress Note  Clinical Social Worker contacted patient by phone to f/u after appt today with Dr. Servando Salina as no insurance listed. No answer at [phone]. Left voicemail requesting call back. I will mail information and my card to pt. Remain available as needed for assistance.    Patient is participating in a Managed Medicaid Plan:  No, no insurance on file.     SDOH Screenings   Tobacco Use: Low Risk  (11/25/2022)   Octavio Graves, MSW, LCSW Clinical Social Worker II [facility] Heart/Vascular Care Navigation  [phone]- work cell phone (preferred) [phone]- desk phone

## 2022-12-21 ENCOUNTER — Ambulatory Visit (HOSPITAL_COMMUNITY): Payer: Self-pay | Attending: Cardiology

## 2022-12-21 DIAGNOSIS — R011 Cardiac murmur, unspecified: Secondary | ICD-10-CM | POA: Insufficient documentation

## 2022-12-21 LAB — ECHOCARDIOGRAM COMPLETE
Area-P 1/2: 3.42 cm2
S' Lateral: 2.3 cm

## 2022-12-22 ENCOUNTER — Encounter: Payer: Self-pay | Admitting: Cardiology

## 2023-02-22 ENCOUNTER — Ambulatory Visit: Payer: Self-pay | Admitting: Cardiology

## 2023-03-05 NOTE — Progress Notes (Unsigned)
Cardiology Clinic Note   Patient Name: Pamela Monroe Date of Encounter: 03/08/2023  Primary Care Provider:  Ollen Bowl, MD Primary Cardiologist:  Thomasene Ripple, DO  Patient Profile    Pamela Monroe 62 year old female presents the clinic today for follow-up evaluation of her hypertension and coronary artery disease.  Past Medical History    Past Medical History:  Diagnosis Date   Osteopenia    Past Surgical History:  Procedure Laterality Date   mixed hyperlipidemia     prediabetes      Allergies  Allergies  Allergen Reactions   Penicillins Rash    History of Present Illness    Pamela Monroe has a PMH of prediabetes, hyperlipidemia, coronary artery disease, cardiac murmur, and elevated blood pressure reading.  She was initially referred to Cardiology by her OB/GYN.  She reported a significant family history of coronary calcification.  Her father had coronary artery disease.  She reported he had a MI in his 69s.  She was initially seen and evaluated by Dr. Servando Salina on 11/25/2022.  She had no cardiac complaints.  She denied shortness of breath and chest pain.  Her coronary calcium score was reviewed.  A echocardiogram was ordered and showed a normal LVEF and no significant valvular abnormalities.  She was placed on a atorvastatin 10 mg daily.  She presents to the clinic today for follow-up evaluation and states she has been working on improving her diet and physical activity.  Her weight today is 176 pounds which is down from 194 pounds we reviewed her previous cardiology visit, coronary calcium scoring, and echocardiogram.  She expressed understanding.  She would like to know what her current cholesterol panel shows and if her A1c has improved.  Her blood pressure has been well-controlled.  Her blood pressure today is 132/82.  She reports that she did not start atorvastatin 10 mg daily.  She wished to defer medication and lieu of increasing physical activity and  diet.  We reviewed importance of fiber and avoiding high cholesterol foods.  I will repeat her lipid panel, A1c, have her maintain her current diet and physical activity and plan follow-up in 12 months..  Today she denies chest pain, shortness of breath, lower extremity edema, fatigue, palpitations, melena, hematuria, hemoptysis, diaphoresis, weakness, presyncope, syncope, orthopnea, and PND.    Home Medications    Prior to Admission medications   Medication Sig Start Date End Date Taking? Authorizing Provider  atorvastatin (LIPITOR) 10 MG tablet Take 1 tablet (10 mg total) by mouth daily. 11/25/22   Tobb, Kardie, DO  cetirizine (ZYRTEC) 5 MG chewable tablet Chew 5 mg by mouth daily.    [provider]  Cholecalciferol (VITAMIN D3) 50 MCG (2000 UT) CAPS Take 1 capsule by mouth daily.    [provider]  naproxen sodium (ANAPROX) 220 MG tablet Take 220 mg by mouth 2 (two) times daily with a meal.    [provider]  ondansetron (ZOFRAN-ODT) 8 MG disintegrating tablet Take 1 tablet (8 mg total) by mouth 2 (two) times daily. 08/09/20   Richardean Chimera, MD  valACYclovir (VALTREX) 1000 MG tablet Take 1 tablet (1,000 mg total) by mouth daily. 07/03/21       Family History    History reviewed. No pertinent family history. has no family status information on file.   Social History    Social History   Socioeconomic History   Marital status: Married    Spouse name: Not on file  Number of children: Not on file   Years of education: Not on file   Highest education level: Not on file  Occupational History   Not on file  Tobacco Use   Smoking status: Never   Smokeless tobacco: Never  Substance and Sexual Activity   Alcohol use: Not on file   Drug use: Not on file   Sexual activity: Not on file  Other Topics Concern   Not on file  Social History Narrative   Not on file   Social Determinants of Health   Financial Resource Strain: Not on file  Food Insecurity:  Not on file  Transportation Needs: Not on file  Physical Activity: Not on file  Stress: Not on file  Social Connections: Not on file  Intimate Partner Violence: Not on file     Review of Systems    General:  No chills, fever, night sweats or weight changes.  Cardiovascular:  No chest pain, dyspnea on exertion, edema, orthopnea, palpitations, paroxysmal nocturnal dyspnea. Dermatological: No rash, lesions/masses Respiratory: No cough, dyspnea Urologic: No hematuria, dysuria Abdominal:   No nausea, vomiting, diarrhea, bright red blood per rectum, melena, or hematemesis Neurologic:  No visual changes, wkns, changes in mental status. All other systems reviewed and are otherwise negative except as noted above.  Physical Exam    VS:  BP 132/82 (BP Location: Left Arm, Patient Position: Sitting, Cuff Size: Normal)   Pulse (!) 59   Ht 5\' 4"  (1.626 m)   Wt 176 lb (79.8 kg)   SpO2 91%   BMI 30.21 kg/m  , BMI Body mass index is 30.21 kg/m. GEN: Well nourished, well developed, in no acute distress. HEENT: normal. Neck: Supple, no JVD, carotid bruits, or masses. Cardiac: RRR, no murmurs, rubs, or gallops. No clubbing, cyanosis, edema.  Radials/DP/PT 2+ and equal bilaterally.  Respiratory:  Respirations regular and unlabored, clear to auscultation bilaterally. GI: Soft, nontender, nondistended, BS + x 4. MS: no deformity or atrophy. Skin: warm and dry, no rash. Neuro:  Strength and sensation are intact. Psych: Normal affect.  Accessory Clinical Findings    Recent Labs: No results found for requested labs within last 365 days.   Recent Lipid Panel No results found for: "CHOL", "TRIG", "HDL", "CHOLHDL", "VLDL", "LDLCALC", "LDLDIRECT"       ECG personally reviewed by me today-none today.     Echocardiogram 12/21/2022  IMPRESSIONS   1. Left ventricular ejection fraction, by estimation, is 60 to 65%. The left ventricle has normal function. The left ventricle has no regional wall  motion abnormalities. Left ventricular diastolic parameters were normal. The average left ventricular  global longitudinal strain is -20.2 %. The global longitudinal strain is normal. 2. Right ventricular systolic function is normal. The right ventricular size is normal. 3. The mitral valve is normal in structure. Trivial mitral valve regurgitation. No evidence of mitral stenosis. 4. The aortic valve is normal in structure. Aortic valve regurgitation is not visualized. No aortic stenosis is present. 5. The inferior vena cava is normal in size with greater than 50% respiratory variability, suggesting right atrial pressure of 3 mmHg.  FINDINGS Left Ventricle: Left ventricular ejection fraction, by estimation, is 60 to 65%. The left ventricle has normal function. The left ventricle has no regional wall motion abnormalities. The average left ventricular global longitudinal strain is -20.2 %.  The global longitudinal strain is normal. The left ventricular internal cavity size was normal in size. There is no left ventricular hypertrophy. Left ventricular diastolic parameters  were normal.  Right Ventricle: The right ventricular size is normal. No increase in right ventricular wall thickness. Right ventricular systolic function is normal.  Left Atrium: Left atrial size was normal in size.  Right Atrium: Right atrial size was normal in size.  Pericardium: There is no evidence of pericardial effusion.  Mitral Valve: The mitral valve is normal in structure. Trivial mitral valve regurgitation. No evidence of mitral valve stenosis.  Tricuspid Valve: The tricuspid valve is normal in structure. Tricuspid valve regurgitation is mild . No evidence of tricuspid stenosis.  Aortic Valve: The aortic valve is normal in structure. Aortic valve regurgitation is not visualized. No aortic stenosis is present.  Pulmonic Valve: The pulmonic valve was normal in structure. Pulmonic valve regurgitation is trivial. No  evidence of pulmonic stenosis.  Aorta: The aortic root is normal in size and structure.  Venous: The inferior vena cava is normal in size with greater than 50% respiratory variability, suggesting right atrial pressure of 3 mmHg.  IAS/Shunts: No atrial level shunt detected by color flow Doppler.   Coronary calcium scoring 09/28/2022  FINDINGS: CORONARY CALCIUM SCORES:   Left Main: 0   LAD: 55.7   LCx: 3.3   RCA: 0.4   Total Agatston Score: 59.4   MESA database percentile: 81   AORTA MEASUREMENTS:   Ascending Aorta: 3.1 cm   Descending Aorta:2.2 cm   OTHER FINDINGS:   Heart is normal size. Aorta normal caliber. Scattered calcifications in the aortic root. No adenopathy. No confluent airspace opacities or effusions. Diffuse low-density throughout the visualized liver compatible with fatty infiltration. Chest wall soft tissues are unremarkable. No acute bony abnormality.   IMPRESSION: Total Agatston score: 59.4   [place] database percentile: 81   Scattered aortic root calcifications.   No acute extra cardiac abnormality.   Hepatic steatosis.     Electronically Signed   By: Charlett Nose M.D.   On: 09/28/2022 15:26   Assessment & Plan   1.  Hyperlipidemia-LDL 124 on 11/12/21.  She has been very diligent with her diet and eating better.  She had not started the atorvastatin. High-fiber diet Increase physical activity as tolerated Repeat fasting lipids-in the next 1-2 weeks  Coronary artery disease-no chest pain today.  Denies recent episodes of arm neck back or chest discomfort. Coronary calcium scoring 09/28/2022 showed a total Agatston score of 59.4.  Details above.   Cardiac murmur-denies increased work of breathing or activity intolerance. Echocardiogram showed trivial mitral valve regurgitation. Repeat echocardiogram when clinically indicated  Prediabetes-A1c 5.7 on 12/28/22. Carb modified diet Follows with PCP Repeat A1c  Disposition: Follow-up with  Dr. Servando Salina or me in 9-12 months.   Thomasene Ripple. Austine Wiedeman NP-C     03/08/2023, 11:29 AM [facility] [address] Office [phone] Fax [phone]    I spent 14 minutes examining this patient, reviewing medications, and using patient centered shared decision making involving her cardiac care.   I spent greater than 20 minutes reviewing her past medical history,  medications, and prior cardiac tests.

## 2023-03-08 ENCOUNTER — Other Ambulatory Visit (HOSPITAL_COMMUNITY): Payer: Self-pay

## 2023-03-08 ENCOUNTER — Ambulatory Visit: Payer: Self-pay | Attending: Cardiology | Admitting: General Practice

## 2023-03-08 ENCOUNTER — Encounter: Payer: Self-pay | Admitting: General Practice

## 2023-03-08 VITALS — BP 132/82 | HR 59 | Ht 64.0 in | Wt 176.0 lb

## 2023-03-08 DIAGNOSIS — R7303 Prediabetes: Secondary | ICD-10-CM

## 2023-03-08 DIAGNOSIS — R011 Cardiac murmur, unspecified: Secondary | ICD-10-CM

## 2023-03-08 DIAGNOSIS — I2510 Atherosclerotic heart disease of native coronary artery without angina pectoris: Secondary | ICD-10-CM

## 2023-03-08 DIAGNOSIS — E783 Hyperchylomicronemia: Secondary | ICD-10-CM

## 2023-03-08 NOTE — Patient Instructions (Addendum)
Medication Instructions:  The current medical regimen is effective;  continue present plan and medications as directed. Please refer to the Current Medication list given to you today.  *If you need a refill on your cardiac medications before your next appointment, please call your pharmacy*  Lab Work: FASTING LIPID AN A1C IN 1-2 WEEKS If you have labs (blood work) drawn today and your tests are completely normal, you will receive your results only by:    MyChart Message (if you have MyChart) OR  A paper copy in the mail If you have any lab test that is abnormal or we need to change your treatment, we will call you to review the results.  Other Instructions MAINTAIN DIET AND  PHYSICAL ACTIVITY-AS TOLERATED  Follow-Up: At [facility], you and your health needs are our priority.  As part of our continuing mission to provide you with exceptional heart care, we have created designated Provider Care Teams.  These Care Teams include your primary Cardiologist (physician) and Advanced Practice Providers (APPs -  Physician Assistants and Nurse Practitioners) who all work together to provide you with the care you need, when you need it.  Your next appointment:   12 month(s)  Provider:   Thomasene Ripple, DO  or Edd Fabian, FNP

## 2023-03-12 ENCOUNTER — Telehealth (HOSPITAL_BASED_OUTPATIENT_CLINIC_OR_DEPARTMENT_OTHER): Payer: Self-pay | Admitting: Licensed Clinical Social Worker

## 2023-03-12 NOTE — Telephone Encounter (Signed)
H&V Care Navigation CSW Progress Note  Clinical Social Worker contacted patient by phone to f/u after appt with Dr. Servando Salina. No answer. Reviewed notes and per MyChart message from pt on 11/30/22 pt shared she was okay being self pay at this time. Remain available as needed.  Patient is participating in a Managed Medicaid Plan:  No, self pay only  SDOH Screenings   Tobacco Use: Low Risk  (03/08/2023)    Octavio Graves, MSW, LCSW Clinical Social Worker II [facility] Heart/Vascular Care Navigation  [phone]- work cell phone (preferred) [phone]- desk phone

## 2023-03-12 NOTE — Telephone Encounter (Signed)
Additional phone note opened in error.  

## 2023-03-16 LAB — LIPID PANEL
Chol/HDL Ratio: 3.5 ratio (ref 0.0–4.4)
Cholesterol, Total: 170 mg/dL (ref 100–199)
HDL: 48 mg/dL (ref >39)
LDL Chol Calc (NIH): 110 mg/dL — ABNORMAL HIGH (ref 0–99)
Triglycerides: 59 mg/dL (ref 0–149)
VLDL Cholesterol Cal: 12 mg/dL (ref 5–40)

## 2023-03-16 LAB — HEMOGLOBIN A1C
Est. average glucose Bld gHb Est-mCnc: 126 mg/dL
Hgb A1c MFr Bld: 6 % — ABNORMAL HIGH (ref 4.8–5.6)

## 2023-03-17 ENCOUNTER — Telehealth: Payer: Self-pay | Admitting: Cardiology

## 2023-03-17 NOTE — Telephone Encounter (Signed)
Patient is returning call in regards to labs and is requesting call back.

## 2023-03-17 NOTE — Telephone Encounter (Signed)
Left message to call back  

## 2023-03-19 NOTE — Telephone Encounter (Signed)
2nd attempt to call patient, no answer left message requesting a call back.

## 2023-03-19 NOTE — Telephone Encounter (Signed)
Patient returned RN Britany's call regarding results.

## 2023-03-19 NOTE — Telephone Encounter (Signed)
Called and spoke to patient  Patient states:   -completed 42month follow up with NP Molli Hazard   -concerned regarding recent lab results   -22lb weight loss with life style changes   -concerned about elevated A1c given despite weight loss and LDL of 110  -she is not taking Lipitor at this time  Informed patient:   -per NP Cleaver result note, LDL 110 is a slight improvement from 124   -NP cleaver recommends continue lifestyle changes and start Lipitor daily  RN reviewed RX instruction/education for Lipitor  Advised patient to follow up in 9-12 months per 10/28 AVS (or sooner if has concerns)  Patient verbalized understanding, no questions at this time

## 2023-03-22 ENCOUNTER — Other Ambulatory Visit: Payer: Self-pay

## 2023-03-22 DIAGNOSIS — E783 Hyperchylomicronemia: Secondary | ICD-10-CM

## 2023-03-22 MED ORDER — ATORVASTATIN CALCIUM 10 MG PO TABS: 10.0000 mg | ORAL_TABLET | Freq: Every day | ORAL | 3 refills | Status: DC

## 2023-06-29 LAB — LAB REPORT - SCANNED: A1c: 5.7

## 2023-09-21 ENCOUNTER — Other Ambulatory Visit: Payer: Self-pay | Admitting: Obstetrics and Gynecology

## 2023-09-21 DIAGNOSIS — Z800 Family history of malignant neoplasm of digestive organs: Secondary | ICD-10-CM

## 2023-09-27 LAB — LAB REPORT - SCANNED
A1c: 5.9
EGFR: 102

## 2023-11-08 ENCOUNTER — Ambulatory Visit
Admission: RE | Admit: 2023-11-08 | Discharge: 2023-11-08 | Disposition: A | Discharge status: Home / Self Care | Payer: Self-pay | Source: Ambulatory Visit | Attending: Obstetrics and Gynecology | Admitting: Obstetrics and Gynecology

## 2023-11-08 DIAGNOSIS — Z800 Family history of malignant neoplasm of digestive organs: Secondary | ICD-10-CM

## 2023-11-08 MED ORDER — GADOPICLENOL 0.5 MMOL/ML IV SOLN
9.0000 mL | Freq: Once | INTRAVENOUS | Status: AC | PRN
  Administered 2023-11-08: 9 mL via INTRAVENOUS

## 2024-01-30 ENCOUNTER — Other Ambulatory Visit: Payer: Self-pay | Admitting: Cardiology

## 2024-01-30 DIAGNOSIS — E783 Hyperchylomicronemia: Secondary | ICD-10-CM

## 2024-02-03 DIAGNOSIS — E783 Hyperchylomicronemia: Secondary | ICD-10-CM

## 2024-02-03 MED ORDER — ATORVASTATIN CALCIUM 10 MG PO TABS: 10.0000 mg | ORAL_TABLET | Freq: Every day | ORAL | 1 refills | Status: DC

## 2024-03-27 ENCOUNTER — Other Ambulatory Visit: Payer: Self-pay | Admitting: Cardiology

## 2024-03-27 DIAGNOSIS — E783 Hyperchylomicronemia: Secondary | ICD-10-CM

## 2024-04-03 ENCOUNTER — Encounter: Payer: Self-pay | Admitting: *Deleted

## 2024-04-03 DIAGNOSIS — I2510 Atherosclerotic heart disease of native coronary artery without angina pectoris: Secondary | ICD-10-CM | POA: Insufficient documentation

## 2024-04-03 DIAGNOSIS — E783 Hyperchylomicronemia: Secondary | ICD-10-CM | POA: Insufficient documentation

## 2024-04-03 NOTE — Progress Notes (Signed)
 "      OFFICE NOTE:    Date:  04/04/2024  ID:  Pamela Monroe, DOB 1961-04-09, MRN [id number] PCP: Vernon Velna SAUNDERS, MD  [facility] Providers Cardiologist:  Dub Huntsman, DO        Coronary artery calcification CAC score 09/28/2022: 59.4 (81st percentile); aortic atherosclerosis TTE 12/21/2022: EF 60-65, no RWMA, normal RVSF, trivial MR Prediabetes Hyperlipidemia Family history of CAD Hepatic steatosis        Discussed the use of AI scribe software for clinical note transcription with the patient, who gave verbal consent to proceed. History of Present Illness Pamela Monroe is a 63 y.o. female returns for follow-up of coronary calcification.  Patient was last seen 02/2023 by Josefa Beauvais, NP.  She is here alone. She has had no chest pain, pressure, tightness, shortness of breath, or swelling in her legs. Her blood pressure readings at home have been in the 130s/80s range, but she notes higher readings in clinical settings.     ROS-See HPI    Studies Reviewed:  EKG Interpretation Date/Time:  Tuesday April 04 2024 10:42:24 EST Ventricular Rate:  68 PR Interval:  176 QRS Duration:  78 QT Interval:  396 QTC Calculation: 421 R Axis:   41  Text Interpretation: Normal sinus rhythm Nonspecific T wave abnormality No significant change since last tracing Confirmed by Lelon Hamilton [phone]) on 04/04/2024 11:07:23 AM   Results LABS Hemoglobin A1c: 5.9 (09/20/2023) TSH: 1.3 (09/20/2023) Total cholesterol: 141 (09/20/2023) Triglycerides: 57 (09/20/2023) HDL: 55 (09/20/2023) LDL: 74 (09/20/2023) Creatinine: 0.57 (09/20/2023) Potassium: 4.5 (09/20/2023) ALT: 24 (09/20/2023)   HYPERTENSION CONTROL Vitals:   04/04/24 1037 04/04/24 1118  BP: (!) 152/90 (!) 150/90    The patient's blood pressure is elevated above target today.  In order to address the patient's elevated BP: Blood pressure will be monitored at home to determine if medication changes need to be  made.         Physical Exam:  VS:  BP (!) 150/90   Pulse 68   Ht 5' 4 (1.626 m)   Wt 182 lb 12.8 oz (82.9 kg)   SpO2 97%   BMI 31.38 kg/m        Wt Readings from Last 3 Encounters:  04/04/24 182 lb 12.8 oz (82.9 kg)  03/08/23 176 lb (79.8 kg)  11/25/22 194 lb 3.2 oz (88.1 kg)    Constitutional:      Appearance: Healthy appearance. Not in distress.  Neck:     Vascular: No carotid bruit. JVD normal.  Pulmonary:     Breath sounds: Normal breath sounds. No wheezing. No rales.  Cardiovascular:     Normal rate. Regular rhythm.     Murmurs: There is no murmur.  Edema:    Peripheral edema absent.  Abdominal:     Palpations: Abdomen is soft.       Assessment and Plan:    Assessment & Plan Coronary artery calcification Calcium  score 59.4 on CT in May 2024. No anginal symptoms reported.   - Continue atorvastatin  10 mg daily - Will follow up in one year Mixed hyperlipidemia Goal LDL < 70. LDL in 09/2023 was 74. She is on low dose Atorvastatin  10 mg once daily.   - Order repeat fasting lipid panel, CMET  - If LDL remains above 70 mg/dL, will increase atorvastatin  to 20 mg daily Elevated blood pressure reading Blood pressure readings in clinical settings are elevated, but home readings are normal. Discussed lifestyle modifications to  manage blood pressure, including dietary changes and increased physical activity. Will need to consider medication if lifestyle changes are insufficient.   - Monitor blood pressure daily for two weeks and report readings via MyChart - Continue with lifestyle modifications to reduce blood pressure - If blood pressure remains elevated, will consider starting hydrochlorothiazide vs ACE inhibitor/ARB Pre-diabetes Pt requests repeat Hb A1c. Will add to labs when Lipids are checked.          Dispo:  Return in about 1 year (around 04/04/2025) for Routine Follow Up w/ Dr. Sheena.  Signed, Glendia Ferrier, PA-C   "

## 2024-04-03 NOTE — Assessment & Plan Note (Signed)
 Calcium  score 59.4 on CT in May 2024.***

## 2024-04-04 ENCOUNTER — Encounter: Payer: Self-pay | Admitting: Physician Assistant

## 2024-04-04 ENCOUNTER — Ambulatory Visit: Payer: Self-pay | Attending: Physician Assistant | Admitting: Physician Assistant

## 2024-04-04 VITALS — BP 150/90 | HR 68 | Ht 64.0 in | Wt 182.8 lb

## 2024-04-04 DIAGNOSIS — I2510 Atherosclerotic heart disease of native coronary artery without angina pectoris: Secondary | ICD-10-CM

## 2024-04-04 DIAGNOSIS — E783 Hyperchylomicronemia: Secondary | ICD-10-CM

## 2024-04-04 DIAGNOSIS — R030 Elevated blood-pressure reading, without diagnosis of hypertension: Secondary | ICD-10-CM

## 2024-04-04 DIAGNOSIS — R7303 Prediabetes: Secondary | ICD-10-CM

## 2024-04-04 MED ORDER — ATORVASTATIN CALCIUM 10 MG PO TABS: 10.0000 mg | ORAL_TABLET | Freq: Every day | ORAL | 3 refills | Status: AC

## 2024-04-04 NOTE — Patient Instructions (Signed)
 Medication Instructions:  NO CHANGES *If you need a refill on your cardiac medications before your next appointment, please call your pharmacy*  Lab Work: LIPID PANEL AND CMET IN 1-2 WEEKS If you have labs (blood work) drawn today and your tests are completely normal, you will receive your results only by: MyChart Message (if you have MyChart) OR A paper copy in the mail If you have any lab test that is abnormal or we need to change your treatment, we will call you to review the results.  Testing/Procedures: NO TESTING  Follow-Up: At [facility], you and your health needs are our priority.  As part of our continuing mission to provide you with exceptional heart care, our providers are all part of one team.  This team includes your primary Cardiologist (physician) and Advanced Practice Providers or APPs (Physician Assistants and Nurse Practitioners) who all work together to provide you with the care you need, when you need it.  Your next appointment:   1 year(s)  Provider:   Kardie Tobb, DO    Other Instructions CHECK BLOOD PRESSURE DAILY FOR 2 WEEKS AND SEND READING VIA [facility]

## 2024-04-04 NOTE — Assessment & Plan Note (Addendum)
 Goal LDL < 70. LDL in 09/2023 was 74. She is on low dose Atorvastatin  10 mg once daily.   - Order repeat fasting lipid panel, CMET  - If LDL remains above 70 mg/dL, will increase atorvastatin  to 20 mg daily

## 2024-04-17 LAB — LIPID PANEL

## 2024-04-18 ENCOUNTER — Ambulatory Visit: Payer: Self-pay | Admitting: Physician Assistant

## 2024-04-18 LAB — LIPID PANEL
Cholesterol, Total: 130 mg/dL (ref 100–199)
HDL: 51 mg/dL (ref >39)
LDL CALC COMMENT:: 2.5 ratio (ref 0.0–4.4)
LDL Chol Calc (NIH): 63 mg/dL (ref 0–99)
Triglycerides: 80 mg/dL (ref 0–149)
VLDL Cholesterol Cal: 16 mg/dL (ref 5–40)

## 2024-04-18 LAB — COMPREHENSIVE METABOLIC PANEL WITH GFR
ALT: 18 IU/L (ref 0–32)
AST: 18 IU/L (ref 0–40)
Albumin: 4.2 g/dL (ref 3.9–4.9)
Alkaline Phosphatase: 83 IU/L (ref 49–135)
BUN/Creatinine Ratio: 18 (ref 12–28)
BUN: 12 mg/dL (ref 8–27)
Bilirubin Total: 0.4 mg/dL (ref 0.0–1.2)
CO2: 26 mmol/L (ref 20–29)
Calcium: 9.3 mg/dL (ref 8.7–10.3)
Chloride: 105 mmol/L (ref 96–106)
Creatinine, Ser: 0.65 mg/dL (ref 0.57–1.00)
Globulin, Total: 2.3 g/dL (ref 1.5–4.5)
Glucose: 92 mg/dL (ref 70–99)
Potassium: 4.3 mmol/L (ref 3.5–5.2)
Sodium: 141 mmol/L (ref 134–144)
Total Protein: 6.5 g/dL (ref 6.0–8.5)
eGFR: 98 mL/min/1.73 (ref >59)

## 2024-04-19 ENCOUNTER — Other Ambulatory Visit: Payer: Self-pay

## 2024-04-19 DIAGNOSIS — R7303 Prediabetes: Secondary | ICD-10-CM

## 2024-04-21 LAB — HEMOGLOBIN A1C
Est. average glucose Bld gHb Est-mCnc: 117 mg/dL
Hgb A1c MFr Bld: 5.7 % — ABNORMAL HIGH (ref 4.8–5.6)
